# Patient Record
Sex: Male | Born: 1945 | Race: Black or African American | Hispanic: No | Marital: Married | Smoking: Former smoker
Health system: Southern US, Community
[De-identification: ages and names within clinical notes are randomized; demographics above are authoritative.]

## PROBLEM LIST (undated history)

## (undated) DIAGNOSIS — C61 Malignant neoplasm of prostate: Secondary | ICD-10-CM

## (undated) DIAGNOSIS — K2970 Gastritis, unspecified, without bleeding: Secondary | ICD-10-CM

## (undated) DIAGNOSIS — E785 Hyperlipidemia, unspecified: Secondary | ICD-10-CM

## (undated) DIAGNOSIS — E119 Type 2 diabetes mellitus without complications: Secondary | ICD-10-CM

## (undated) DIAGNOSIS — K7460 Unspecified cirrhosis of liver: Secondary | ICD-10-CM

## (undated) DIAGNOSIS — K219 Gastro-esophageal reflux disease without esophagitis: Secondary | ICD-10-CM

## (undated) DIAGNOSIS — C220 Liver cell carcinoma: Secondary | ICD-10-CM

## (undated) DIAGNOSIS — B1910 Unspecified viral hepatitis B without hepatic coma: Secondary | ICD-10-CM

## (undated) DIAGNOSIS — B1920 Unspecified viral hepatitis C without hepatic coma: Secondary | ICD-10-CM

## (undated) HISTORY — DX: Type 2 diabetes mellitus without complications: E11.9

## (undated) HISTORY — PX: PROSTATE BIOPSY: SHX241

## (undated) HISTORY — DX: Gastro-esophageal reflux disease without esophagitis: K21.9

## (undated) HISTORY — DX: Malignant neoplasm of prostate: C61

## (undated) HISTORY — PX: OTHER SURGICAL HISTORY: SHX169

---

## 2005-05-18 ENCOUNTER — Ambulatory Visit: Payer: Self-pay | Admitting: Gastroenterology

## 2007-10-30 ENCOUNTER — Ambulatory Visit: Payer: Self-pay | Admitting: Gastroenterology

## 2008-03-01 ENCOUNTER — Emergency Department: Payer: Self-pay | Admitting: Unknown Physician Specialty

## 2008-03-03 ENCOUNTER — Emergency Department: Payer: Self-pay | Admitting: Emergency Medicine

## 2008-03-05 ENCOUNTER — Emergency Department: Payer: Self-pay | Admitting: Emergency Medicine

## 2011-09-13 ENCOUNTER — Emergency Department: Payer: Self-pay | Admitting: Unknown Physician Specialty

## 2011-09-13 LAB — COMPREHENSIVE METABOLIC PANEL
Albumin: 4 g/dL (ref 3.4–5.0)
Anion Gap: 7 (ref 7–16)
BUN: 17 mg/dL (ref 7–18)
Bilirubin,Total: 0.5 mg/dL (ref 0.2–1.0)
Calcium, Total: 9.3 mg/dL (ref 8.5–10.1)
Chloride: 108 mmol/L — ABNORMAL HIGH (ref 98–107)
Co2: 24 mmol/L (ref 21–32)
EGFR (African American): >60
EGFR (Non-African Amer.): >60
Glucose: 93 mg/dL (ref 65–99)
SGOT(AST): 54 U/L — ABNORMAL HIGH (ref 15–37)
Total Protein: 8.3 g/dL — ABNORMAL HIGH (ref 6.4–8.2)

## 2011-09-13 LAB — CBC
HCT: 43.4 %
HGB: 13.7 g/dL
MCH: 27.1 pg
MCHC: 31.6 g/dL — ABNORMAL LOW
MCV: 86 fL
Platelet: 148 x10 3/mm 3 — ABNORMAL LOW
RBC: 5.06 x10 6/mm 3
RDW: 14.7 % — ABNORMAL HIGH
WBC: 9.1 x10 3/mm 3

## 2011-10-19 ENCOUNTER — Ambulatory Visit: Payer: Self-pay | Admitting: Unknown Physician Specialty

## 2011-11-05 ENCOUNTER — Ambulatory Visit: Payer: Self-pay | Admitting: Unknown Physician Specialty

## 2012-04-30 HISTORY — PX: COLONOSCOPY W/ BIOPSIES: SHX1374

## 2012-07-07 ENCOUNTER — Ambulatory Visit: Payer: BC Managed Care – PPO | Admitting: Internal Medicine

## 2012-07-11 ENCOUNTER — Ambulatory Visit: Payer: BC Managed Care – PPO | Admitting: Internal Medicine

## 2012-07-15 ENCOUNTER — Encounter: Payer: Self-pay | Admitting: Internal Medicine

## 2012-07-15 ENCOUNTER — Ambulatory Visit (INDEPENDENT_AMBULATORY_CARE_PROVIDER_SITE_OTHER): Payer: BC Managed Care – PPO | Admitting: Internal Medicine

## 2012-07-15 VITALS — BP 118/72 | HR 56 | Temp 97.8°F | Resp 10 | Ht 68.0 in | Wt 183.0 lb

## 2012-07-15 DIAGNOSIS — E119 Type 2 diabetes mellitus without complications: Secondary | ICD-10-CM

## 2012-07-15 MED ORDER — GLUCOSE BLOOD VI STRP: ORAL_STRIP | Status: DC

## 2012-07-15 MED ORDER — ONETOUCH ULTRASOFT LANCETS MISC: Status: DC

## 2012-07-15 NOTE — Progress Notes (Signed)
Subjective:     Patient ID: Rodney Moody, male   DOB: 03-12-1946, 67 y.o.   MRN: [id number]  HPI Rodney Moody is a 67 -year-old [place] American man, referred by his PCP, Dr. Wonda Cheng, due to a new diagnosis of diabetes. The patient was found to have a hemoglobin A1c of 7% a month ago. She was told that he might have diabetes, and was referred to endocrinology.   The patient has a family history of DM2 in his mother and brother. He is a very active life, walking every day at work ([employer]), and also when he is not working, he goes for walks with his wife, who was diagnosed with hypertension. Since his wife's diagnosis, they changed in way of eating, to include more vegetables, and baked rather than fried foods. He still had regular sodas and other sweets, but he had switched to drinking only water and cutting down on his sweets after he was told that he might have diabetes. Meals: - breakfast: skipping it or boiled eggs - lunch: breakfast, bag of chips, used to drink soft drinks - regular, not anymore, since his dx - dinner: baked chicken, green vegetables - snacks: 1 a day  Last eye exam was in January 2014. No diabetic retinopathy. He denies numbness and tingling in his feet. Per labs (06/19/2012) received from patient's PCP, the patient does not have kidney dysfunction, with the last BUN/creatinine being 18/0.99, for a GFR of 92. Other labs: lipid panel 245/89/101/126, LFTs at the upper limit of normal, TSH 0.776, vitamin D 29.5.  The patient has a past medical history of hepatitis C-followed by Dr. Marva Panda, vitamin D deficiency, history of prostate cancer, and GERD.  Past surgical history: Arthroscopic shoulder surgery (triceps repair left), prostatectomy  History   Social History  . Marital Status: Married    Spouse Name: N/A    Number of Children: 1   Occupational History  . Works at [employer], walking all day   Social History Main Topics  . Smoking status: Former Smoker    Quit date:  02/25/1998  . Smokeless tobacco: Never Used  . Alcohol Use: No  . Drug Use: No  . Sexually Active: Yes -- Male partner(s)   Social History Narrative   Married with 1 child   Current Outpatient Rx  Name  Route  Sig  Dispense  Refill  . glucose blood (ONE TOUCH ULTRA TEST) test strip      Use as instructed   100 each   12   . Lancets (ONETOUCH ULTRASOFT) lancets      Use as instructed   100 each   12   . Multiple Vitamin (MULTI-VITAMIN DAILY PO)   Oral   Take 2 tablets by mouth daily.         . pantoprazole (PROTONIX) 40 MG tablet   Oral   Take 40 mg by mouth daily.           No allergies.  Review of Systems - all negative Constitutional: no weight gain/loss, no fatigue, no subjective hyperthermia/hypothermia Eyes: no blurry vision, no xerophthalmia ENT: no sore throat, no nodules palpated in throat, no dysphagia/odynophagia, no hoarseness Cardiovascular: no CP/SOB/palpitations/leg swelling Respiratory: no cough/SOB Gastrointestinal: no N/V/D/C Musculoskeletal: no muscle/joint aches Skin: no rashes Neurological: no tremors/numbness/tingling/dizziness Psychiatric: no depression/anxiety  Objective:   Physical Exam BP 118/72  Pulse 56  Temp(Src) 97.8 F (36.6 C) (Oral)  Resp 10  Ht 5\' 8"  (1.727 m)  Wt 183 lb (83.008 kg)  BMI 27.83 kg/m2  SpO2 98% Wt Readings from Last 3 Encounters:  07/15/12 183 lb (83.008 kg)  Constitutional: slightly overweight, in NAD Eyes: PERRLA, EOMI, no exophthalmos ENT: moist mucous membranes, no thyromegaly, no cervical lymphadenopathy Cardiovascular: RRR, No MRG Respiratory: CTA B Gastrointestinal: abdomen soft, NT, ND, BS+ Musculoskeletal: no deformities, strength intact in all 4 Skin: moist, warm, no rashes Neurological: no tremor with outstretched hands, DTR normal in all 4  Assessment:     1. DM2, new dx - non-insulin-dependent, uncomplicated - HbA1C 7%, repeat 6.8%    Plan:     1. We had a long discussion  about what his diagnosis of diabetes entails, and also bothered by that we need to confirm it by repeating the hemoglobin A1c, which I ordered today. - Discussed about proper diet, and I strongly recommended that he doesn't skip his breakfast (which he started to do in an effort to lose some weight). I actually advised him to have the breakfast is the largest meal of the day, followed by lunch, and then a smaller meal for dinner. I advised him to use sweet potatoes or yams rather than white potatoes, brown rather than white rice, whole-grain rather than white bread, whole-wheat rather than white pasta, fruits rather than concentrated sweets, and as many vegetables as he can. I strongly advised him to stay off sodas, and drink water. I advised him to continue to exercise, however he does that by the nature of his work. - I explained the significance of the hemoglobin A1c test for diagnosis and for diabetes followup, and at the target hemoglobin A1c for patients with diabetes is lower than 7%  - we also discussed about possible consequences and complications of diabetes, including heart disease, stroke, kidney disease, peripheral neuropathy, eye disease, etc. - I explained interval for followup, usually every 3 months, and the type of tests that we do to check diabetes control - I recommended him to have an eye exam every year - I will recheck a hemoglobin A1c and microalbumin/creatinine ratio today - I advised him to join my chart, to where I will release the results - I gave him a sugar log, and I advised him how to fill it, in the next 3 months history check every 1 or 2 days, in a.m., fasting. He should bring the log to next appointment - I gave him a One Touch ultra meter, and I sent a prescription for strips and lancets to his pharmacy - We demonstrated how he should check his sugars - given foot care handout and explained the principles - given instructions for hypoglycemia management "15-15 rule" -  given a brochure about eating out, and healthy eating and diabetes - I referred him to diabetes education and nutrition for further education and advice - RTC in 3 months with his log  Office Visit on 07/15/2012  Component Date Value Range Status  . Hemoglobin A1C 07/15/2012 6.8* <5.7 % Final   Comment:  According to the ADA Clinical Practice Recommendations for 2011, when                          HbA1c is used as a screening test:                                                       >=6.5%   Diagnostic of Diabetes Mellitus                                     (if abnormal result is confirmed)                                                     5.7-6.4%   Increased risk of developing Diabetes Mellitus                                                     References:Diagnosis and Classification of Diabetes Mellitus,Diabetes                          Care,2011,34(Suppl 1):S62-S69 and Standards of Medical Care in                                  Diabetes - 2011,Diabetes Care,2011,34 (Suppl 1):S11-S61.                             . Mean Plasma Glucose 07/15/2012 148* <117 mg/dL Final  . Microalb, Ur 40/98/1191 0.50  0.00 - 1.89 mg/dL Final  . Creatinine, Urine 07/15/2012 153.8   Final  . Microalb Creat Ratio 07/15/2012 3.3  0.0 - 30.0 mg/g Final  Confirmed diabetes dx. Plan as above. Will release labs to MyChart.

## 2012-07-15 NOTE — Patient Instructions (Addendum)
Please join MyChart. I referred you to the Diabetes educators - they will call you with the appointment date and time. Please check your sugars once a day or once every 2 days. Please return in 3 months with your sugar log.

## 2012-07-16 ENCOUNTER — Encounter: Payer: Self-pay | Admitting: Internal Medicine

## 2012-07-16 LAB — MICROALBUMIN / CREATININE URINE RATIO: Microalb Creat Ratio: 3.3 mg/g (ref 0.0–30.0)

## 2012-07-22 ENCOUNTER — Encounter: Payer: BC Managed Care – PPO | Attending: Internal Medicine | Admitting: *Deleted

## 2012-07-22 VITALS — Ht 68.0 in | Wt 184.5 lb

## 2012-07-22 DIAGNOSIS — Z713 Dietary counseling and surveillance: Secondary | ICD-10-CM | POA: Insufficient documentation

## 2012-07-22 DIAGNOSIS — E119 Type 2 diabetes mellitus without complications: Secondary | ICD-10-CM

## 2012-07-27 ENCOUNTER — Encounter: Payer: Self-pay | Admitting: *Deleted

## 2012-07-27 NOTE — Progress Notes (Signed)
Patient was seen on 07/22/2012 for the first of a series of three diabetes self-management courses at the [facility]. Patient's most recent A1c was 6.8 % on 07/15/2012 The following learning objectives were met by the patient during this course:   Defines the role of glucose and insulin  Identifies type of diabetes and pathophysiology  Defines the diagnostic criteria for diabetes and prediabetes  States the risk factors for Type 2 Diabetes  States the symptoms of Type 2 Diabetes  Defines Type 2 Diabetes treatment goals  Defines Type 2 Diabetes treatment options  States the rationale for glucose monitoring  Identifies A1C, glucose targets, and testing times  Identifies proper sharps disposal  Defines the purpose of a diabetes food plan  Identifies carbohydrate food groups  Defines effects of carbohydrate foods on glucose levels  Identifies carbohydrate choices/grams/food labels  States benefits of physical activity and effect on glucose  Review of suggested activity guidelines  Handouts given during class include:  Type 2 Diabetes: Basics Book  My Food Plan Book  Food and Activity Log   Follow-Up Plan: Core Class 2

## 2012-08-12 ENCOUNTER — Encounter: Payer: BC Managed Care – PPO | Attending: Internal Medicine | Admitting: *Deleted

## 2012-08-12 DIAGNOSIS — E119 Type 2 diabetes mellitus without complications: Secondary | ICD-10-CM | POA: Insufficient documentation

## 2012-08-12 DIAGNOSIS — Z713 Dietary counseling and surveillance: Secondary | ICD-10-CM | POA: Insufficient documentation

## 2012-08-13 NOTE — Progress Notes (Signed)
  Patient was seen on 08/12/12 for the second of a series of three diabetes self-management courses at the [facility]. The following learning objectives were met by the patient during this course:   Explain basic nutrition maintenance and quality assurance  Describe causes, symptoms and treatment of hypoglycemia and hyperglycemia  Explain how to manage diabetes during illness  Describe the importance of good nutrition for health and healthy eating strategies  List strategies to follow meal plan when dining out  Describe the effects of alcohol on glucose and how to use it safely  Describe problem solving skills for day-to-day glucose challenges  Describe strategies to use when treatment plan needs to change  Identify important factors involved in successful weight loss  Describe ways to remain physically active  Describe the impact of regular activity on insulin resistance    Handouts given in class:  Refrigerator magnet for Sick Day Guidelines  [facility] Oral medication/insulin handout  Follow-Up Plan: Patient will attend the final class of the ADA Diabetes Self-Care Education.    

## 2012-08-26 ENCOUNTER — Encounter: Payer: BC Managed Care – PPO | Admitting: Dietician

## 2012-08-26 DIAGNOSIS — E119 Type 2 diabetes mellitus without complications: Secondary | ICD-10-CM

## 2012-08-27 NOTE — Progress Notes (Signed)
  Patient was seen on 08/26/2012 for the third of a series of three diabetes self-management courses at the [facility]. The following learning objectives were met by the patient during this course:  Ht: 68 in  WT: 184.5 lbs  BMI: 28.1 kg/m2  A1C: 6.8 (07/15/2012)    Describe how diabetes changes over time   Identify diabetes complications and ways to prevent them   Describe strategies that can promote heart health including lowering blood pressure and cholesterol   Describe strategies to lower dietary fat and sodium in the diet   Identify physical activities that benefit cardiovascular health   Evaluate success in meeting personal goal   Describe the belief that they can live successfully with diabetes day to day   Establish 2-3 goals that they will plan to diligently work on until they return for the free 28-month follow-up visit  The following handouts were given in class:  3 Month Follow Up Visit handout  Goal setting handout  Class evaluation form  Your patient has established the following 3 month goals for diabetes self-care:  Be active 100 minutes or more 3 times per week  To help manage my stress, I will do helping people at least 3 times a week by caring them places and doing chores for them   Follow-Up Plan: Patient will attend a 3 month follow-up visit for diabetes self-management education.

## 2012-10-24 ENCOUNTER — Encounter: Payer: Self-pay | Admitting: *Deleted

## 2012-10-24 ENCOUNTER — Encounter: Payer: BC Managed Care – PPO | Attending: Internal Medicine | Admitting: *Deleted

## 2012-10-24 DIAGNOSIS — Z713 Dietary counseling and surveillance: Secondary | ICD-10-CM | POA: Insufficient documentation

## 2012-10-24 DIAGNOSIS — E119 Type 2 diabetes mellitus without complications: Secondary | ICD-10-CM | POA: Insufficient documentation

## 2012-10-24 NOTE — Progress Notes (Signed)
  Patient was seen on 10/24/2012 for their 3 month follow-up as a part of the diabetes self-management courses at the [facility]. The following learning objectives were met by your patient during this visit:  At his last visit: Ht: 68 in  WT: 184.5 lbs  BMI: 28.1 kg/m2  A1C: 6.8 (07/15/2012)  Current weight on 10/24/2012 is 176.5 lb for weight loss of 8 pounds! Has not had another A1c since he attended class in March.  Your patient has established the following 3 month goals for diabetes self-care:  Be active 100 minutes or more 3 times per week yes, he is walking and climbing at work and he walks in addition to work too.  To help manage my stress, I will do helping people at least 3 times a week by caring them places and doing chores for them YES  Patient self reports the following:  Diabetes control has improved since diabetes self-management training: Definetly Number of days blood glucose is >200: none Last MD appointment for diabetes: March, before class Changes in treatment plan: he is walking more and he is eating breakfast now Confidence with ability to manage diabetes: Definetly Areas for improvement with diabetes self-care: no, doing very well Willingness to participate in diabetes support group: not at this time  Please see Diabetes Flow sheet for findings related to patient's self-care.  Follow-Up Plan: Patient is eligible for a "free" 30 minute diabetes self-care appointment in the next year. Patient to call and schedule as needed.

## 2012-11-28 ENCOUNTER — Ambulatory Visit: Payer: Self-pay | Admitting: Gastroenterology

## 2012-11-28 LAB — CBC WITH DIFFERENTIAL/PLATELET
Basophil #: 0 10*3/uL (ref 0.0–0.1)
Eosinophil #: 0.1 10*3/uL (ref 0.0–0.7)
HCT: 42.1 % (ref 40.0–52.0)
HGB: 14.4 g/dL (ref 13.0–18.0)
Lymphocyte #: 1.2 10*3/uL (ref 1.0–3.6)
Lymphocyte %: 26.1 %
MCH: 27.6 pg (ref 26.0–34.0)
MCHC: 34.2 g/dL (ref 32.0–36.0)
Neutrophil %: 62.3 %
Platelet: 159 10*3/uL (ref 150–440)
RBC: 5.21 10*6/uL (ref 4.40–5.90)
RDW: 14.7 % — ABNORMAL HIGH (ref 11.5–14.5)
WBC: 4.6 10*3/uL (ref 3.8–10.6)

## 2012-11-28 LAB — PROTIME-INR: INR: 1

## 2014-03-14 ENCOUNTER — Emergency Department: Payer: Self-pay | Admitting: Emergency Medicine

## 2014-03-15 LAB — CBC WITH DIFFERENTIAL/PLATELET
BASOS ABS: 0 10*3/uL (ref 0.0–0.1)
BASOS PCT: 0.3 %
EOS ABS: 0.1 10*3/uL (ref 0.0–0.7)
EOS PCT: 1.6 %
HCT: 40 % (ref 40.0–52.0)
HGB: 13 g/dL (ref 13.0–18.0)
LYMPHS ABS: 1.3 10*3/uL (ref 1.0–3.6)
LYMPHS PCT: 17.2 %
MCH: 27.2 pg (ref 26.0–34.0)
MCHC: 32.6 g/dL (ref 32.0–36.0)
MCV: 84 fL (ref 80–100)
MONOS PCT: 11.2 %
Monocyte #: 0.8 x10 3/mm (ref 0.2–1.0)
Neutrophil #: 5.1 10*3/uL (ref 1.4–6.5)
Neutrophil %: 69.7 %
Platelet: 154 10*3/uL (ref 150–440)
RBC: 4.79 10*6/uL (ref 4.40–5.90)
RDW: 15 % — ABNORMAL HIGH (ref 11.5–14.5)
WBC: 7.3 10*3/uL (ref 3.8–10.6)

## 2014-03-15 LAB — BASIC METABOLIC PANEL
Anion Gap: 4 — ABNORMAL LOW (ref 7–16)
BUN: 19 mg/dL — AB (ref 7–18)
CHLORIDE: 105 mmol/L (ref 98–107)
CO2: 30 mmol/L (ref 21–32)
Calcium, Total: 9.1 mg/dL (ref 8.5–10.1)
Creatinine: 1 mg/dL (ref 0.60–1.30)
EGFR (Non-African Amer.): >60
GLUCOSE: 124 mg/dL — AB (ref 65–99)
OSMOLALITY: 281 (ref 275–301)
Potassium: 4.7 mmol/L (ref 3.5–5.1)
SODIUM: 139 mmol/L (ref 136–145)
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
## 2014-11-08 ENCOUNTER — Telehealth: Payer: Self-pay | Admitting: Family Medicine

## 2014-11-08 NOTE — Telephone Encounter (Signed)
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

## 2014-11-10 MED ORDER — PANTOPRAZOLE SODIUM 40 MG PO TBEC: 40.0000 mg | DELAYED_RELEASE_TABLET | Freq: Every day | ORAL | Status: DC

## 2014-11-10 NOTE — Telephone Encounter (Signed)
Refilled 30 day suppy of Protonic sent to [facility] [address]. Patient has appointment on 11/19/2014

## 2014-11-19 ENCOUNTER — Ambulatory Visit (INDEPENDENT_AMBULATORY_CARE_PROVIDER_SITE_OTHER): Payer: BLUE CROSS/BLUE SHIELD | Admitting: Family Medicine

## 2014-11-19 ENCOUNTER — Encounter: Payer: Self-pay | Admitting: Family Medicine

## 2014-11-19 VITALS — BP 128/71 | HR 61 | Temp 97.6°F | Resp 17 | Ht 69.0 in | Wt 183.3 lb

## 2014-11-19 DIAGNOSIS — E1169 Type 2 diabetes mellitus with other specified complication: Secondary | ICD-10-CM | POA: Insufficient documentation

## 2014-11-19 DIAGNOSIS — K219 Gastro-esophageal reflux disease without esophagitis: Secondary | ICD-10-CM | POA: Insufficient documentation

## 2014-11-19 DIAGNOSIS — E119 Type 2 diabetes mellitus without complications: Secondary | ICD-10-CM | POA: Diagnosis not present

## 2014-11-19 DIAGNOSIS — E785 Hyperlipidemia, unspecified: Secondary | ICD-10-CM | POA: Insufficient documentation

## 2014-11-19 DIAGNOSIS — C61 Malignant neoplasm of prostate: Secondary | ICD-10-CM | POA: Insufficient documentation

## 2014-11-19 MED ORDER — PANTOPRAZOLE SODIUM 40 MG PO TBEC: 40.0000 mg | DELAYED_RELEASE_TABLET | Freq: Every day | ORAL | Status: DC

## 2014-11-19 NOTE — Progress Notes (Signed)
Name: Rodney Moody   MRN: [id number]    DOB: 1945/07/10   Date:11/19/2014       Progress Note  Subjective  Chief Complaint  Chief Complaint  Patient presents with  . Establish Care    NP (Dr. Liana Gerold)  . Diabetes    Diabetes He presents for his follow-up diabetic visit. He has type 2 diabetes mellitus. His disease course has been stable. There are no hypoglycemic associated symptoms. Pertinent negatives for diabetes include no chest pain, no fatigue, no polydipsia and no polyuria. Symptoms are stable. Risk factors for coronary artery disease include dyslipidemia, male sex and diabetes mellitus. Current diabetic treatment includes diet. His weight is stable. He is following a diabetic and generally healthy diet. He has not had a previous visit with a dietitian. He rarely participates in exercise. His breakfast blood glucose is taken between 7-8 am. His breakfast blood glucose range is generally 110-130 mg/dl. An ACE inhibitor/angiotensin II receptor blocker is not being taken.  Hyperlipidemia This is a chronic problem. The problem is uncontrolled. Recent lipid tests were reviewed and are high. Exacerbating diseases include diabetes. He has no history of hypothyroidism or liver disease. Pertinent negatives include no chest pain, leg pain, myalgias or shortness of breath. He is currently on no antihyperlipidemic treatment. Risk factors for coronary artery disease include diabetes mellitus, dyslipidemia, family history and male sex.  Heartburn He complains of belching and heartburn. He reports no abdominal pain, no chest pain, no choking, no coughing, no nausea or no sore throat. This is a chronic problem. The problem has been unchanged. The symptoms are aggravated by certain foods (orange juice, ). Pertinent negatives include no fatigue or melena. He has tried a PPI for the symptoms. The treatment provided significant relief. Past procedures do not include an EGD.      Past Medical History   Diagnosis Date  . GERD (gastroesophageal reflux disease)   . Diabetes mellitus without complication     borderline Diabetes, but no medication  . Prostate cancer     Past Surgical History  Procedure Laterality Date  . Left shoulder    . Prostate biopsy      Family History  Problem Relation Age of Onset  . Diabetes Mother   . Heart disease Brother   . Hypertension Brother     History   Social History  . Marital Status: Married    Spouse Name: N/A  . Number of Children: N/A  . Years of Education: N/A   Occupational History  . Not on file.   Social History Main Topics  . Smoking status: Former Smoker    Quit date: 02/25/1998  . Smokeless tobacco: Never Used  . Alcohol Use: No  . Drug Use: No  . Sexual Activity:    Partners: Female   Other Topics Concern  . Not on file   Social History Narrative   Married with 1 child           Current outpatient prescriptions:  .  Calcium Citrate-Vitamin D (CALCIUM CITRATE + D PO), Take 2,000 Units by mouth 2 (two) times daily., Disp: , Rfl:  .  glucose blood (ONE TOUCH ULTRA TEST) test strip, Use as instructed, Disp: 100 each, Rfl: 12 .  Lancets (ONETOUCH ULTRASOFT) lancets, Use as instructed, Disp: 100 each, Rfl: 12 .  Multiple Vitamin (MULTI-VITAMIN DAILY PO), Take 2 tablets by mouth daily., Disp: , Rfl:  .  pantoprazole (PROTONIX) 40 MG tablet, Take 1 tablet (40 mg  total) by mouth daily., Disp: 30 tablet, Rfl: 0  No Known Allergies   Review of Systems  Constitutional: Negative for fatigue.  HENT: Negative for sore throat.   Respiratory: Negative for cough, choking and shortness of breath.   Cardiovascular: Negative for chest pain.  Gastrointestinal: Positive for heartburn. Negative for nausea, abdominal pain and melena.  Musculoskeletal: Negative for myalgias.  Endo/Heme/Allergies: Negative for polydipsia.      Objective  Filed Vitals:   11/19/14 0959  BP: 128/71  Pulse: 61  Temp: 97.6 F (36.4 C)   TempSrc: Oral  Resp: 17  Height: 5\' 9"  (1.753 m)  Weight: 183 lb 4.8 oz (83.144 kg)  SpO2: 98%    Physical Exam  Constitutional: He is oriented to person, place, and time and well-developed, well-nourished, and in no distress.  HENT:  Head: Normocephalic.  Cardiovascular: Normal rate and regular rhythm.   Pulmonary/Chest: Effort normal and breath sounds normal.  Musculoskeletal: He exhibits no edema.  Neurological: He is alert and oriented to person, place, and time.  Skin: Skin is warm and dry.  Nursing note and vitals reviewed.   Assessment & Plan  1. Gastroesophageal reflux disease, esophagitis presence not specified Symptoms responsive to chronic PPI therapy. - pantoprazole (PROTONIX) 40 MG tablet; Take 1 tablet (40 mg total) by mouth daily.  Dispense: 90 tablet; Refill: 1  2. Diabetes mellitus without complication Patient is on no pharmacotherapy for diabetes mellitus. Recheck A1c and follow-up. - HgB A1c - Urine Microalbumin w/creat. ratio  3. Hyperlipidemia  - Lipid panel - Comprehensive Metabolic Panel (CMET)    Rodney Moody [facility] 11/19/2014 10:50 AM

## 2014-11-27 LAB — COMPREHENSIVE METABOLIC PANEL
ALBUMIN: 4.5 g/dL (ref 3.6–4.8)
ALK PHOS: 50 IU/L (ref 39–117)
ALT: 48 IU/L — ABNORMAL HIGH (ref 0–44)
AST: 49 IU/L — ABNORMAL HIGH (ref 0–40)
Albumin/Globulin Ratio: 1.5 (ref 1.1–2.5)
BILIRUBIN TOTAL: 0.6 mg/dL (ref 0.0–1.2)
BUN/Creatinine Ratio: 22 (ref 10–22)
BUN: 20 mg/dL (ref 8–27)
CHLORIDE: 99 mmol/L (ref 97–108)
CO2: 26 mmol/L (ref 18–29)
CREATININE: 0.91 mg/dL (ref 0.76–1.27)
Calcium: 9.5 mg/dL (ref 8.6–10.2)
GFR calc non Af Amer: 87 mL/min/{1.73_m2} (ref >59)
GFR, EST AFRICAN AMERICAN: 100 mL/min/{1.73_m2} (ref >59)
GLUCOSE: 87 mg/dL (ref 65–99)
Globulin, Total: 3 g/dL (ref 1.5–4.5)
Potassium: 4.5 mmol/L (ref 3.5–5.2)
Sodium: 141 mmol/L (ref 134–144)
TOTAL PROTEIN: 7.5 g/dL (ref 6.0–8.5)

## 2014-11-27 LAB — HEMOGLOBIN A1C
ESTIMATED AVERAGE GLUCOSE: 143 mg/dL
Hgb A1c MFr Bld: 6.6 % — ABNORMAL HIGH (ref 4.8–5.6)

## 2014-11-27 LAB — MICROALBUMIN / CREATININE URINE RATIO
Creatinine, Urine: 127.1 mg/dL
MICROALB/CREAT RATIO: 3.3 mg/g creat (ref 0.0–30.0)
MICROALBUM., U, RANDOM: 4.2 ug/mL

## 2014-11-27 LAB — LIPID PANEL
CHOL/HDL RATIO: 2.1 ratio (ref 0.0–5.0)
Cholesterol, Total: 212 mg/dL — ABNORMAL HIGH (ref 100–199)
HDL: 102 mg/dL (ref >39)
LDL Calculated: 99 mg/dL (ref 0–99)
Triglycerides: 57 mg/dL (ref 0–149)
VLDL Cholesterol Cal: 11 mg/dL (ref 5–40)

## 2014-11-30 ENCOUNTER — Telehealth: Payer: Self-pay

## 2014-11-30 NOTE — Telephone Encounter (Signed)
Left pt vm of labs

## 2015-02-25 ENCOUNTER — Ambulatory Visit (INDEPENDENT_AMBULATORY_CARE_PROVIDER_SITE_OTHER): Payer: BLUE CROSS/BLUE SHIELD | Admitting: Family Medicine

## 2015-02-25 ENCOUNTER — Encounter: Payer: Self-pay | Admitting: Family Medicine

## 2015-02-25 VITALS — BP 136/72 | HR 65 | Temp 98.2°F | Resp 18 | Ht 69.0 in | Wt 187.0 lb

## 2015-02-25 DIAGNOSIS — E119 Type 2 diabetes mellitus without complications: Secondary | ICD-10-CM | POA: Diagnosis not present

## 2015-02-25 DIAGNOSIS — R748 Abnormal levels of other serum enzymes: Secondary | ICD-10-CM

## 2015-02-25 DIAGNOSIS — E785 Hyperlipidemia, unspecified: Secondary | ICD-10-CM | POA: Diagnosis not present

## 2015-02-25 LAB — GLUCOSE, POCT (MANUAL RESULT ENTRY): POC GLUCOSE: 97 mg/dL (ref 70–99)

## 2015-02-25 NOTE — Progress Notes (Signed)
Name: Rodney Moody   MRN: [id number]    DOB: 05-09-1945   Date:02/25/2015       Progress Note  Subjective  Chief Complaint  Chief Complaint  Patient presents with  . Diabetes    pt here for 3 month follow up and lab work  . Hyperlipidemia    Diabetes He presents for his follow-up diabetic visit. He has type 2 diabetes mellitus. His disease course has been stable. There are no hypoglycemic associated symptoms. There are no diabetic associated symptoms. Pertinent negatives for diabetes include no chest pain. Current diabetic treatment includes diet. His weight is stable. An ACE inhibitor/angiotensin II receptor blocker is not being taken.  Hyperlipidemia This is a chronic problem. The problem is controlled. Pertinent negatives include no chest pain or shortness of breath. Current antihyperlipidemic treatment includes statins and diet change.    Past Medical History  Diagnosis Date  . GERD (gastroesophageal reflux disease)   . Diabetes mellitus without complication ([place])     borderline Diabetes, but no medication  . Prostate cancer [facility])     Past Surgical History  Procedure Laterality Date  . Left shoulder    . Prostate biopsy      Family History  Problem Relation Age of Onset  . Diabetes Mother   . Heart disease Brother   . Hypertension Brother     Social History   Social History  . Marital Status: Married    Spouse Name: N/A  . Number of Children: N/A  . Years of Education: N/A   Occupational History  . Not on file.   Social History Main Topics  . Smoking status: Former Smoker    Quit date: 02/25/1998  . Smokeless tobacco: Never Used  . Alcohol Use: No  . Drug Use: No  . Sexual Activity:    Partners: Female   Other Topics Concern  . Not on file   Social History Narrative   Married with 1 child          Current outpatient prescriptions:  .  Calcium Citrate-Vitamin D (CALCIUM CITRATE + D PO), Take 2,000 Units by mouth 2 (two) times daily., Disp: ,  Rfl:  .  glucose blood (ONE TOUCH ULTRA TEST) test strip, Use as instructed, Disp: 100 each, Rfl: 12 .  Lancets (ONETOUCH ULTRASOFT) lancets, Use as instructed, Disp: 100 each, Rfl: 12 .  Multiple Vitamin (MULTI-VITAMIN DAILY PO), Take 2 tablets by mouth daily., Disp: , Rfl:  .  pantoprazole (PROTONIX) 40 MG tablet, Take 1 tablet (40 mg total) by mouth daily., Disp: 90 tablet, Rfl: 1  No Known Allergies  Review of Systems  Constitutional: Negative for fever and chills.  Respiratory: Negative for shortness of breath.   Cardiovascular: Negative for chest pain.  Gastrointestinal: Negative for abdominal pain.    Objective  Filed Vitals:   02/25/15 1120  BP: 136/72  Pulse: 65  Temp: 98.2 F (36.8 C)  Resp: 18  Height: 5\' 9"  (1.753 m)  Weight: 187 lb (84.823 kg)  SpO2: 95%    Physical Exam  Constitutional: He is oriented to person, place, and time and well-developed, well-nourished, and in no distress.  HENT:  Head: Normocephalic and atraumatic.  Cardiovascular: Normal rate, regular rhythm and normal heart sounds.   Pulmonary/Chest: Effort normal. He has wheezes. He has no rales.  Abdominal: Soft. Bowel sounds are normal. There is no tenderness.  Neurological: He is alert and oriented to person, place, and time.  Nursing note and vitals  reviewed.    Assessment & Plan  1. Diabetes mellitus without complication ([place]) Diet-controlled diabetes mellitus. We will obtain an A1c today and follow-up. - POCT HgB A1C - POCT Glucose (CBG)  2. Hyperlipidemia Recheck FLP and consider statin therapy for primary prevention. - Lipid Profile - Comprehensive Metabolic Panel (CMET)  3. Elevated liver enzymes  - Comprehensive Metabolic Panel (CMET)   Ayvin Lipinski [facility] 02/25/2015 11:59 AM

## 2015-03-05 LAB — LIPID PANEL
Chol/HDL Ratio: 2.3 ratio units (ref 0.0–5.0)
Cholesterol, Total: 206 mg/dL — ABNORMAL HIGH (ref 100–199)
HDL: 90 mg/dL (ref >39)
LDL Calculated: 104 mg/dL — ABNORMAL HIGH (ref 0–99)
Triglycerides: 62 mg/dL (ref 0–149)
VLDL Cholesterol Cal: 12 mg/dL (ref 5–40)

## 2015-03-05 LAB — COMPREHENSIVE METABOLIC PANEL
ALT: 43 IU/L (ref 0–44)
AST: 45 IU/L — ABNORMAL HIGH (ref 0–40)
Albumin/Globulin Ratio: 1.3 (ref 1.1–2.5)
Albumin: 4.3 g/dL (ref 3.6–4.8)
Alkaline Phosphatase: 54 IU/L (ref 39–117)
BILIRUBIN TOTAL: 0.5 mg/dL (ref 0.0–1.2)
BUN / CREAT RATIO: 14 (ref 10–22)
BUN: 14 mg/dL (ref 8–27)
CHLORIDE: 100 mmol/L (ref 97–106)
CO2: 26 mmol/L (ref 18–29)
CREATININE: 0.99 mg/dL (ref 0.76–1.27)
Calcium: 9.3 mg/dL (ref 8.6–10.2)
GFR calc Af Amer: 90 mL/min/{1.73_m2} (ref >59)
GFR calc non Af Amer: 78 mL/min/{1.73_m2} (ref >59)
Globulin, Total: 3.3 g/dL (ref 1.5–4.5)
Glucose: 90 mg/dL (ref 65–99)
Potassium: 4.5 mmol/L (ref 3.5–5.2)
Sodium: 141 mmol/L (ref 136–144)
TOTAL PROTEIN: 7.6 g/dL (ref 6.0–8.5)

## 2015-03-11 ENCOUNTER — Telehealth: Payer: Self-pay | Admitting: Family Medicine

## 2015-03-11 NOTE — Telephone Encounter (Signed)
Patient called back for his lab results.  I relayed the result note from Dr. Manuella Ghazi.  Patient stated that his next appointment is on 05/26/14 and wanted to know if he could just want until then to start the statin therapy or does he need to come sooner.

## 2015-03-15 ENCOUNTER — Other Ambulatory Visit: Payer: Self-pay | Admitting: Family Medicine

## 2015-03-15 NOTE — Telephone Encounter (Signed)
Left voicemail asking patient to call clinic and schedule a follow up appointment with the next 2 weeks

## 2015-03-18 ENCOUNTER — Ambulatory Visit (INDEPENDENT_AMBULATORY_CARE_PROVIDER_SITE_OTHER): Payer: BLUE CROSS/BLUE SHIELD | Admitting: Family Medicine

## 2015-03-18 ENCOUNTER — Encounter: Payer: Self-pay | Admitting: Family Medicine

## 2015-03-18 VITALS — BP 134/75 | HR 67 | Temp 98.5°F | Resp 16 | Ht 69.0 in | Wt 183.0 lb

## 2015-03-18 DIAGNOSIS — E785 Hyperlipidemia, unspecified: Secondary | ICD-10-CM | POA: Diagnosis not present

## 2015-03-18 DIAGNOSIS — E119 Type 2 diabetes mellitus without complications: Secondary | ICD-10-CM | POA: Diagnosis not present

## 2015-03-18 DIAGNOSIS — R748 Abnormal levels of other serum enzymes: Secondary | ICD-10-CM | POA: Diagnosis not present

## 2015-03-18 LAB — POCT GLYCOSYLATED HEMOGLOBIN (HGB A1C): HEMOGLOBIN A1C: 6.4

## 2015-03-18 LAB — GLUCOSE, POCT (MANUAL RESULT ENTRY): POC Glucose: 94 mg/dl (ref 70–99)

## 2015-03-18 NOTE — Progress Notes (Signed)
Name: Rodney Moody   MRN: [id number]    DOB: [birth year]   Date:03/18/2015       Progress Note  Subjective  Chief Complaint  Chief Complaint  Patient presents with  . Advice Only    Discuss blood work    Diabetes He presents for his follow-up diabetic visit. He has type 2 diabetes mellitus. His disease course has been stable. Pertinent negatives for diabetes include no chest pain, no fatigue, no foot paresthesias, no polydipsia, no polyphagia and no polyuria. Current diabetic treatment includes diet. His weight is stable. He monitors blood glucose at home 1-2 x per week.  Hyperlipidemia This is a chronic problem. The problem is controlled. Recent lipid tests were reviewed and are high. Pertinent negatives include no chest pain. He is currently on no antihyperlipidemic treatment.    Past Medical History  Diagnosis Date  . GERD (gastroesophageal reflux disease)   . Diabetes mellitus without complication ([place])     borderline Diabetes, but no medication  . Prostate cancer [facility])     Past Surgical History  Procedure Laterality Date  . Left shoulder    . Prostate biopsy      Family History  Problem Relation Age of Onset  . Diabetes Mother   . Heart disease Brother   . Hypertension Brother     Social History   Social History  . Marital Status: Married    Spouse Name: N/A  . Number of Children: N/A  . Years of Education: N/A   Occupational History  . Not on file.   Social History Main Topics  . Smoking status: Former Smoker    Quit date: 02/25/1998  . Smokeless tobacco: Never Used  . Alcohol Use: No  . Drug Use: No  . Sexual Activity:    Partners: Female   Other Topics Concern  . Not on file   Social History Narrative   Married with 1 child           Current outpatient prescriptions:  .  Calcium Citrate-Vitamin D (CALCIUM CITRATE + D PO), Take 2,000 Units by mouth 2 (two) times daily., Disp: , Rfl:  .  glucose blood (ONE TOUCH ULTRA TEST) test strip, Use  as instructed, Disp: 100 each, Rfl: 12 .  Lancets (ONETOUCH ULTRASOFT) lancets, Use as instructed, Disp: 100 each, Rfl: 12 .  Multiple Vitamin (MULTI-VITAMIN DAILY PO), Take 2 tablets by mouth daily., Disp: , Rfl:  .  pantoprazole (PROTONIX) 40 MG tablet, TAKE 1 TABLET BY MOUTH EVERY DAY, Disp: 90 tablet, Rfl: 1  No Known Allergies   Review of Systems  Constitutional: Negative for fever, chills and fatigue.  Cardiovascular: Negative for chest pain.  Gastrointestinal: Negative for heartburn, nausea, vomiting, abdominal pain, diarrhea, constipation and blood in stool.  Endo/Heme/Allergies: Negative for polydipsia and polyphagia.      Objective  Filed Vitals:   03/18/15 1204  BP: 134/75  Pulse: 67  Temp: 98.5 F (36.9 C)  TempSrc: Oral  Resp: 16  Height: 5\' 9"  (1.753 m)  Weight: 183 lb (83.008 kg)  SpO2: 99%    Physical Exam  Constitutional: He is oriented to person, place, and time and well-developed, well-nourished, and in no distress.  HENT:  Head: Normocephalic and atraumatic.  Cardiovascular: Normal rate and regular rhythm.   No murmur heard. Pulmonary/Chest: Effort normal and breath sounds normal. He has no wheezes.  Abdominal: Soft. Bowel sounds are normal. There is no tenderness.  Neurological: He is alert and oriented to  person, place, and time.  Skin: Skin is warm and dry.  Nursing note and vitals reviewed.   Assessment & Plan  1. Hyperlipidemia With diabetes, we will consider starting on statin therapy.  2. Controlled type 2 diabetes mellitus without complication, without long-term current use of insulin (HCC) Stable and well controlled with A1c of 6.4%, which is improved from 6.6%. - POCT HgB A1C - POCT Glucose (CBG)  3. Elevated liver enzymes  - Comprehensive Metabolic Panel (CMET)   Leilyn Frayre [facility] 03/18/2015 12:39 PM

## 2015-03-19 LAB — COMPREHENSIVE METABOLIC PANEL
ALBUMIN: 4.6 g/dL (ref 3.6–4.8)
ALK PHOS: 55 IU/L (ref 39–117)
ALT: 45 IU/L — ABNORMAL HIGH (ref 0–44)
AST: 54 IU/L — ABNORMAL HIGH (ref 0–40)
Albumin/Globulin Ratio: 1.4 (ref 1.1–2.5)
BUN / CREAT RATIO: 22 (ref 10–22)
BUN: 19 mg/dL (ref 8–27)
Bilirubin Total: 0.5 mg/dL (ref 0.0–1.2)
CO2: 26 mmol/L (ref 18–29)
CREATININE: 0.86 mg/dL (ref 0.76–1.27)
Calcium: 9.8 mg/dL (ref 8.6–10.2)
Chloride: 96 mmol/L — ABNORMAL LOW (ref 97–106)
GFR calc non Af Amer: 89 mL/min/{1.73_m2} (ref >59)
GFR, EST AFRICAN AMERICAN: 103 mL/min/{1.73_m2} (ref >59)
GLOBULIN, TOTAL: 3.2 g/dL (ref 1.5–4.5)
Glucose: 85 mg/dL (ref 65–99)
Potassium: 4.4 mmol/L (ref 3.5–5.2)
Sodium: 138 mmol/L (ref 136–144)
Total Protein: 7.8 g/dL (ref 6.0–8.5)

## 2015-05-27 ENCOUNTER — Encounter: Payer: Self-pay | Admitting: Family Medicine

## 2015-05-27 ENCOUNTER — Ambulatory Visit (INDEPENDENT_AMBULATORY_CARE_PROVIDER_SITE_OTHER): Payer: BLUE CROSS/BLUE SHIELD | Admitting: Family Medicine

## 2015-05-27 VITALS — BP 128/77 | HR 65 | Temp 98.4°F | Resp 18 | Ht 69.0 in | Wt 184.9 lb

## 2015-05-27 DIAGNOSIS — E785 Hyperlipidemia, unspecified: Secondary | ICD-10-CM | POA: Diagnosis not present

## 2015-05-27 DIAGNOSIS — R748 Abnormal levels of other serum enzymes: Secondary | ICD-10-CM

## 2015-05-27 DIAGNOSIS — E119 Type 2 diabetes mellitus without complications: Secondary | ICD-10-CM | POA: Diagnosis not present

## 2015-05-27 NOTE — Progress Notes (Signed)
Name: Rodney Moody   MRN: [id number]    DOB: [birth year]   Date:05/27/2015       Progress Note  Subjective  Chief Complaint  Chief Complaint  Patient presents with  . Follow-up    3 mo  . Diabetes  . Hyperlipidemia  . Gastroesophageal Reflux    Diabetes He presents for his follow-up diabetic visit. He has type 2 diabetes mellitus. His disease course has been stable. Pertinent negatives for diabetes include no chest pain, no foot paresthesias, no polydipsia and no polyuria. Current diabetic treatment includes diet. Frequency home blood tests: Does not check his BG.  Hyperlipidemia This is a chronic problem. The problem is uncontrolled. Recent lipid tests were reviewed and are high. Pertinent negatives include no chest pain or shortness of breath. He is currently on no antihyperlipidemic treatment (Has elevated liver enzymes.).    Past Medical History  Diagnosis Date  . GERD (gastroesophageal reflux disease)   . Diabetes mellitus without complication ([facility])     borderline Diabetes, but no medication  . Prostate cancer [facility])     Past Surgical History  Procedure Laterality Date  . Left shoulder    . Prostate biopsy      Family History  Problem Relation Age of Onset  . Diabetes Mother   . Heart disease Brother   . Hypertension Brother     Social History   Social History  . Marital Status: Married    Spouse Name: N/A  . Number of Children: N/A  . Years of Education: N/A   Occupational History  . Not on file.   Social History Main Topics  . Smoking status: Former Smoker    Quit date: 02/25/1998  . Smokeless tobacco: Never Used  . Alcohol Use: No  . Drug Use: No  . Sexual Activity:    Partners: Female   Other Topics Concern  . Not on file   Social History Narrative   Married with 1 child          Current outpatient prescriptions:  .  Calcium Citrate-Vitamin D (CALCIUM CITRATE + D PO), Take 2,000 Units by mouth 2 (two) times daily., Disp: , Rfl:  .   glucose blood (ONE TOUCH ULTRA TEST) test strip, Use as instructed, Disp: 100 each, Rfl: 12 .  Lancets (ONETOUCH ULTRASOFT) lancets, Use as instructed, Disp: 100 each, Rfl: 12 .  Multiple Vitamin (MULTI-VITAMIN DAILY PO), Take 2 tablets by mouth daily., Disp: , Rfl:  .  pantoprazole (PROTONIX) 40 MG tablet, TAKE 1 TABLET BY MOUTH EVERY DAY, Disp: 90 tablet, Rfl: 1  No Known Allergies   Review of Systems  Constitutional: Negative for fever, chills and malaise/fatigue.  Respiratory: Negative for shortness of breath.   Cardiovascular: Negative for chest pain and palpitations.  Gastrointestinal: Negative for abdominal pain.  Endo/Heme/Allergies: Negative for polydipsia.    Objective  Filed Vitals:   05/27/15 1133  BP: 128/77  Pulse: 65  Temp: 98.4 F (36.9 C)  TempSrc: Oral  Resp: 18  Height: 5\' 9"  (1.753 m)  Weight: 184 lb 14.4 oz (83.87 kg)  SpO2: 98%    Physical Exam  Constitutional: He is oriented to person, place, and time and well-developed, well-nourished, and in no distress.  HENT:  Head: Normocephalic and atraumatic.  Cardiovascular: Normal rate and regular rhythm.   Pulmonary/Chest: Effort normal and breath sounds normal.  Abdominal: Soft. Bowel sounds are normal.  Neurological: He is alert and oriented to person, place, and time.  Nursing  note and vitals reviewed.   Assessment & Plan  1. Elevated liver enzymes Obtain standard initial workup for transaminitis. - Comprehensive Metabolic Panel (CMET) - US Abdomen Limited RUQ; Future - Gamma GT - Hepatitis, Acute  2. Hyperlipidemia Statin therapy indicated but on hold secondary to elevated liver enzymes.  3. Controlled type 2 diabetes mellitus without complication, without long-term current use of insulin (HCC) Diet controlled. Recheck A1c in 4-6 weeks.   Karess Harner [facility] 05/27/2015 11:45 AM

## 2015-05-28 LAB — COMPREHENSIVE METABOLIC PANEL
A/G RATIO: 1.4 (ref 1.1–2.5)
ALBUMIN: 4.6 g/dL (ref 3.6–4.8)
ALT: 39 IU/L (ref 0–44)
AST: 41 IU/L — ABNORMAL HIGH (ref 0–40)
Alkaline Phosphatase: 61 IU/L (ref 39–117)
BILIRUBIN TOTAL: 0.4 mg/dL (ref 0.0–1.2)
BUN / CREAT RATIO: 19 (ref 10–22)
BUN: 18 mg/dL (ref 8–27)
CHLORIDE: 97 mmol/L (ref 96–106)
CO2: 25 mmol/L (ref 18–29)
Calcium: 9.8 mg/dL (ref 8.6–10.2)
Creatinine, Ser: 0.97 mg/dL (ref 0.76–1.27)
GFR calc non Af Amer: 80 mL/min/{1.73_m2} (ref >59)
GFR, EST AFRICAN AMERICAN: 92 mL/min/{1.73_m2} (ref >59)
Globulin, Total: 3.2 g/dL (ref 1.5–4.5)
Glucose: 95 mg/dL (ref 65–99)
POTASSIUM: 4.5 mmol/L (ref 3.5–5.2)
Sodium: 139 mmol/L (ref 134–144)
Total Protein: 7.8 g/dL (ref 6.0–8.5)

## 2015-05-28 LAB — HEPATITIS PANEL, ACUTE
HEP A IGM: NEGATIVE
Hep B C IgM: NEGATIVE
Hep C Virus Ab: >11 s/co ratio — ABNORMAL HIGH (ref 0.0–0.9)
Hepatitis B Surface Ag: NEGATIVE

## 2015-05-28 LAB — GAMMA GT: GGT: 70 IU/L — AB (ref 0–65)

## 2015-06-10 ENCOUNTER — Ambulatory Visit
Admission: RE | Admit: 2015-06-10 | Discharge: 2015-06-10 | Disposition: A | Discharge status: Home / Self Care | Payer: BLUE CROSS/BLUE SHIELD | Source: Ambulatory Visit | Attending: Family Medicine | Admitting: Family Medicine

## 2015-06-10 DIAGNOSIS — R160 Hepatomegaly, not elsewhere classified: Secondary | ICD-10-CM | POA: Insufficient documentation

## 2015-06-10 DIAGNOSIS — R748 Abnormal levels of other serum enzymes: Secondary | ICD-10-CM

## 2015-07-01 ENCOUNTER — Encounter: Payer: Self-pay | Admitting: Family Medicine

## 2015-07-01 ENCOUNTER — Ambulatory Visit (INDEPENDENT_AMBULATORY_CARE_PROVIDER_SITE_OTHER): Payer: BLUE CROSS/BLUE SHIELD | Admitting: Family Medicine

## 2015-07-01 VITALS — BP 130/80 | HR 62 | Temp 97.8°F | Resp 18 | Ht 69.0 in | Wt 182.3 lb

## 2015-07-01 DIAGNOSIS — R768 Other specified abnormal immunological findings in serum: Secondary | ICD-10-CM

## 2015-07-01 DIAGNOSIS — R894 Abnormal immunological findings in specimens from other organs, systems and tissues: Secondary | ICD-10-CM | POA: Diagnosis not present

## 2015-07-01 DIAGNOSIS — R160 Hepatomegaly, not elsewhere classified: Secondary | ICD-10-CM | POA: Insufficient documentation

## 2015-07-01 NOTE — Progress Notes (Signed)
Name: Rodney Moody   MRN: [id number]    DOB: [birth year]   Date:07/01/2015       Progress Note  Subjective  Chief Complaint  Chief Complaint  Patient presents with  . Hyperlipidemia  . Abdominal Pain    follow up Ultrasound from February    HPI  Follow up from RUQ U/S Pt. Returns to discuss the RUQ Ultrasound findings. It was obtained on June 10, 2015. Showed a 5.2 x 4.4 x 3.8 cm mass in left hepatic lobe. Pt. deniess any abdominal pain, nausea, vomiting, fatigue, changes in urine or stool.  Past Medical History  Diagnosis Date  . GERD (gastroesophageal reflux disease)   . Diabetes mellitus without complication ([place])     borderline Diabetes, but no medication  . Prostate cancer [facility])     Past Surgical History  Procedure Laterality Date  . Left shoulder    . Prostate biopsy      Family History  Problem Relation Age of Onset  . Diabetes Mother   . Heart disease Brother   . Hypertension Brother     Social History   Social History  . Marital Status: Married    Spouse Name: N/A  . Number of Children: N/A  . Years of Education: N/A   Occupational History  . Not on file.   Social History Main Topics  . Smoking status: Former Smoker    Quit date: 02/25/1998  . Smokeless tobacco: Never Used  . Alcohol Use: No  . Drug Use: No  . Sexual Activity:    Partners: Female   Other Topics Concern  . Not on file   Social History Narrative   Married with 1 child           Current outpatient prescriptions:  .  Calcium Citrate-Vitamin D (CALCIUM CITRATE + D PO), Take 2,000 Units by mouth 2 (two) times daily., Disp: , Rfl:  .  glucose blood (ONE TOUCH ULTRA TEST) test strip, Use as instructed, Disp: 100 each, Rfl: 12 .  Lancets (ONETOUCH ULTRASOFT) lancets, Use as instructed, Disp: 100 each, Rfl: 12 .  Multiple Vitamin (MULTI-VITAMIN DAILY PO), Take 2 tablets by mouth daily., Disp: , Rfl:  .  pantoprazole (PROTONIX) 40 MG tablet, TAKE 1 TABLET BY MOUTH EVERY DAY,  Disp: 90 tablet, Rfl: 1  No Known Allergies   ROS    Objective  Filed Vitals:   07/01/15 1101  BP: 130/80  Pulse: 62  Temp: 97.8 F (36.6 C)  TempSrc: Oral  Resp: 18  Height: 5\' 9"  (1.753 m)  Weight: 182 lb 4.8 oz (82.691 kg)  SpO2: 97%    Physical Exam  Constitutional: He is oriented to person, place, and time and well-developed, well-nourished, and in no distress.  HENT:  Head: Normocephalic and atraumatic.  Cardiovascular: Normal rate and regular rhythm.   Pulmonary/Chest: Effort normal and breath sounds normal.  Abdominal: Soft. Bowel sounds are normal.  Neurological: He is alert and oriented to person, place, and time.  Nursing note and vitals reviewed.      Recent Results (from the past 2160 hour(s))  Comprehensive Metabolic Panel (CMET)     Status: Abnormal   Collection Time: 05/27/15 12:03 PM  Result Value Ref Range   Glucose 95 65 - 99 mg/dL   BUN 18 8 - 27 mg/dL   Creatinine, Ser 0.97 0.76 - 1.27 mg/dL   GFR calc non Af Amer 80 >59 mL/min/1.73   GFR calc Af Amer 92 >59 mL/min/1.73  BUN/Creatinine Ratio 19 10 - 22   Sodium 139 134 - 144 mmol/L   Potassium 4.5 3.5 - 5.2 mmol/L   Chloride 97 96 - 106 mmol/L   CO2 25 18 - 29 mmol/L   Calcium 9.8 8.6 - 10.2 mg/dL   Total Protein 7.8 6.0 - 8.5 g/dL   Albumin 4.6 3.6 - 4.8 g/dL   Globulin, Total 3.2 1.5 - 4.5 g/dL   Albumin/Globulin Ratio 1.4 1.1 - 2.5   Bilirubin Total 0.4 0.0 - 1.2 mg/dL   Alkaline Phosphatase 61 39 - 117 IU/L   AST 41 (H) 0 - 40 IU/L   ALT 39 0 - 44 IU/L  Gamma GT     Status: Abnormal   Collection Time: 05/27/15 12:03 PM  Result Value Ref Range   GGT 70 (H) 0 - 65 IU/L  Hepatitis, Acute     Status: Abnormal   Collection Time: 05/27/15 12:03 PM  Result Value Ref Range   Hep A IgM Negative Negative   Hepatitis B Surface Ag Negative Negative   Hep B C IgM Negative Negative   Hep C Virus Ab >11.0 (H) 0.0 - 0.9 s/co ratio    Comment:                                   Negative:      < 0.8                              Indeterminate: 0.8 - 0.9                                   Positive:     > 0.9  The CDC recommends that a positive HCV antibody result  be followed up with a HCV Nucleic Acid Amplification  test [id number]).      Assessment & Plan  1. Mass of left lobe of liver Reviewed ultrasound findings, we will order MRI with contrast for further evaluation of the left hepatic lobe mass. - MR Abdomen W Contrast; Future  2. Hepatitis C antibody test positive Patient has reportedly been treated for hepatitis C in the past. We will refer to gastroenterology in light of the positive antibody test and that hepatic lobe mass for further evaluation - Ambulatory referral to Gastroenterology   Dossie Der [facility] 07/01/2015 11:40 AM

## 2015-07-27 ENCOUNTER — Other Ambulatory Visit: Payer: Self-pay | Admitting: Family Medicine

## 2015-07-27 ENCOUNTER — Ambulatory Visit
Admission: RE | Admit: 2015-07-27 | Discharge: 2015-07-27 | Disposition: A | Discharge status: Home / Self Care | Payer: BLUE CROSS/BLUE SHIELD | Source: Ambulatory Visit | Attending: Family Medicine | Admitting: Family Medicine

## 2015-07-27 DIAGNOSIS — R160 Hepatomegaly, not elsewhere classified: Secondary | ICD-10-CM

## 2015-07-27 DIAGNOSIS — R599 Enlarged lymph nodes, unspecified: Secondary | ICD-10-CM | POA: Insufficient documentation

## 2015-07-27 DIAGNOSIS — I700 Atherosclerosis of aorta: Secondary | ICD-10-CM | POA: Insufficient documentation

## 2015-07-27 MED ORDER — GADOBENATE DIMEGLUMINE 529 MG/ML IV SOLN
20.0000 mL | Freq: Once | INTRAVENOUS | Status: AC | PRN
  Administered 2015-07-27: 17 mL via INTRAVENOUS

## 2015-07-29 ENCOUNTER — Telehealth: Payer: Self-pay

## 2015-07-29 NOTE — Telephone Encounter (Signed)
error 

## 2015-07-29 NOTE — Telephone Encounter (Signed)
  Oncology Nurse Navigator Documentation  Navigator Location: [facility] (07/29/15 1000) Navigator Encounter Type: Telephone;Introductory phone call (07/29/15 1000) Telephone: Lahoma Crocker Call;Appt Confirmation/Clarification (07/29/15 1000) Abnormal Finding Date: 06/10/15 (07/29/15 1000)         Treatment Phase: Abnormal Scans (07/29/15 1000) Barriers/Navigation Needs: Coordination of Care (07/29/15 1000)   Interventions: Coordination of Care (07/29/15 1000)   Coordination of Care: Appts (07/29/15 1000)        Acuity: Level 2 (07/29/15 1000)   Acuity Level 2: Initial guidance, education and coordination as needed;Educational needs;Assistance expediting appointments;Ongoing guidance and education throughout treatment as needed (07/29/15 1000)     Time Spent with Patient: 15 (07/29/15 1000)   New referral for liver mass. Pt left voicemail to arrange consult with medical oncology. Appt scheduled for 4/4 1500 in [place] with Dr Rogue Bussing. Dr Trena Platt office to also be notified of date/time of consult

## 2015-07-29 NOTE — Telephone Encounter (Signed)
  Oncology Nurse Navigator Documentation  Navigator Location: [facility] (07/29/15 1600) Navigator Encounter Type: Telephone (07/29/15 1600)                   Interventions: Coordination of Care (07/29/15 1600)   Coordination of Care: Appts (07/29/15 1600)                  Time Spent with Patient: 15 (07/29/15 1600)   Rodney Moody returned call. Appt confirmed for new consult with Dr Rogue Bussing in [facility] 08-02-15 at 1500. Directions given. Read back performed.

## 2015-08-02 ENCOUNTER — Inpatient Hospital Stay: Payer: BLUE CROSS/BLUE SHIELD | Attending: Internal Medicine | Admitting: Internal Medicine

## 2015-08-02 ENCOUNTER — Inpatient Hospital Stay: Payer: BLUE CROSS/BLUE SHIELD

## 2015-08-02 VITALS — BP 155/85 | HR 64 | Temp 98.1°F | Resp 18 | Wt 182.1 lb

## 2015-08-02 DIAGNOSIS — K219 Gastro-esophageal reflux disease without esophagitis: Secondary | ICD-10-CM | POA: Diagnosis not present

## 2015-08-02 DIAGNOSIS — Z923 Personal history of irradiation: Secondary | ICD-10-CM | POA: Insufficient documentation

## 2015-08-02 DIAGNOSIS — I700 Atherosclerosis of aorta: Secondary | ICD-10-CM | POA: Insufficient documentation

## 2015-08-02 DIAGNOSIS — Z87891 Personal history of nicotine dependence: Secondary | ICD-10-CM | POA: Diagnosis not present

## 2015-08-02 DIAGNOSIS — Z79899 Other long term (current) drug therapy: Secondary | ICD-10-CM | POA: Diagnosis not present

## 2015-08-02 DIAGNOSIS — R160 Hepatomegaly, not elsewhere classified: Secondary | ICD-10-CM | POA: Diagnosis not present

## 2015-08-02 DIAGNOSIS — R7989 Other specified abnormal findings of blood chemistry: Secondary | ICD-10-CM | POA: Insufficient documentation

## 2015-08-02 DIAGNOSIS — B1920 Unspecified viral hepatitis C without hepatic coma: Secondary | ICD-10-CM | POA: Insufficient documentation

## 2015-08-02 DIAGNOSIS — Z8546 Personal history of malignant neoplasm of prostate: Secondary | ICD-10-CM | POA: Diagnosis not present

## 2015-08-02 DIAGNOSIS — Z7982 Long term (current) use of aspirin: Secondary | ICD-10-CM | POA: Diagnosis not present

## 2015-08-02 DIAGNOSIS — R894 Abnormal immunological findings in specimens from other organs, systems and tissues: Secondary | ICD-10-CM | POA: Diagnosis not present

## 2015-08-02 DIAGNOSIS — R599 Enlarged lymph nodes, unspecified: Secondary | ICD-10-CM | POA: Diagnosis not present

## 2015-08-02 DIAGNOSIS — E119 Type 2 diabetes mellitus without complications: Secondary | ICD-10-CM | POA: Insufficient documentation

## 2015-08-02 DIAGNOSIS — R1013 Epigastric pain: Secondary | ICD-10-CM | POA: Diagnosis not present

## 2015-08-02 LAB — COMPREHENSIVE METABOLIC PANEL
ALBUMIN: 4 g/dL (ref 3.5–5.0)
ALT: 56 U/L (ref 17–63)
AST: 58 U/L — AB (ref 15–41)
Alkaline Phosphatase: 64 U/L (ref 38–126)
Anion gap: 6 (ref 5–15)
BUN: 24 mg/dL — AB (ref 6–20)
CHLORIDE: 105 mmol/L (ref 101–111)
CO2: 27 mmol/L (ref 22–32)
CREATININE: 1.12 mg/dL (ref 0.61–1.24)
Calcium: 9.3 mg/dL (ref 8.9–10.3)
GFR calc Af Amer: >60 mL/min (ref >60)
GLUCOSE: 117 mg/dL — AB (ref 65–99)
POTASSIUM: 3.9 mmol/L (ref 3.5–5.1)
Sodium: 138 mmol/L (ref 135–145)
Total Bilirubin: 0.3 mg/dL (ref 0.3–1.2)
Total Protein: 8.2 g/dL — ABNORMAL HIGH (ref 6.5–8.1)

## 2015-08-02 LAB — CBC WITH DIFFERENTIAL/PLATELET
BASOS ABS: 0 10*3/uL (ref 0–0.1)
BASOS PCT: 0 %
Eosinophils Absolute: 0.1 10*3/uL (ref 0–0.7)
Eosinophils Relative: 3 %
HEMATOCRIT: 41 % (ref 40.0–52.0)
HEMOGLOBIN: 13.5 g/dL (ref 13.0–18.0)
LYMPHS PCT: 30 %
Lymphs Abs: 1.4 10*3/uL (ref 1.0–3.6)
MCH: 27.2 pg (ref 26.0–34.0)
MCHC: 32.8 g/dL (ref 32.0–36.0)
MCV: 82.9 fL (ref 80.0–100.0)
MONO ABS: 0.4 10*3/uL (ref 0.2–1.0)
MONOS PCT: 9 %
NEUTROS ABS: 2.9 10*3/uL (ref 1.4–6.5)
NEUTROS PCT: 58 %
Platelets: 164 10*3/uL (ref 150–440)
RBC: 4.95 MIL/uL (ref 4.40–5.90)
RDW: 14.4 % (ref 11.5–14.5)
WBC: 4.9 10*3/uL (ref 3.8–10.6)

## 2015-08-02 LAB — PROTIME-INR
INR: 0.98
PROTHROMBIN TIME: 13.2 s (ref 11.4–15.0)

## 2015-08-02 LAB — APTT: aPTT: 34 seconds (ref 24–36)

## 2015-08-02 NOTE — Progress Notes (Signed)
[place] NOTE  Patient Care Team: Roselee Nova, MD as PCP - General (Family Medicine)  CHIEF COMPLAINTS/PURPOSE OF CONSULTATION:   # March 2017- LEFT LOBE LIVER MASS- 7.2x5.1x4.8cm ? Extension in distal left portal Vein;Porta hepatis adenopathy- 2 .1 x 1.9 cm- Recm Bx  # 2002- Hepatitis C s/p ? Interferon injections  # 2013-  PROSTATE CANCER [Radiation seeds[facility]; Urology]  HISTORY OF PRESENTING ILLNESS:  Rodney Moody 70 y.o.  male with a history of hepatitis C and history of borderline diabetes/not on medications- noted to have slightly elevated/abnormal liver function which was initially attributed to his statins. Patient has a follow-up had ultrasound that was abnormal- 4 left lobe liver mass/which is subsequently followed up by an MRI of the liver with above findings. He has been deferred was for further recommendations and evaluations.  Patient's appetite is good. Denies any weight loss. Denies any leg swelling. No nausea no vomiting. No chest pain. No cough. Denies any abdominal distention. He intermittently has epigastric discomfort which he attributes to history of reflux disease.   ROS: A complete 10 point review of system is done which is negative except mentioned above in history of present illness  MEDICAL HISTORY:  Past Medical History  Diagnosis Date  . GERD (gastroesophageal reflux disease)   . Diabetes mellitus without complication ([place])     borderline Diabetes, but no medication  . Prostate cancer [facility])     SURGICAL HISTORY: Past Surgical History  Procedure Laterality Date  . Left shoulder    . Prostate biopsy      SOCIAL HISTORY: Works in [employer]. Lives in [place] and son. No alcohol. No smoking. Social History   Social History  . Marital Status: Married    Spouse Name: N/A  . Number of Children: N/A  . Years of Education: N/A   Occupational History  . Not on file.   Social History Main Topics  . Smoking status:  Former Smoker    Quit date: 02/25/1998  . Smokeless tobacco: Never Used  . Alcohol Use: No  . Drug Use: No  . Sexual Activity:    Partners: Female   Other Topics Concern  . Not on file   Social History Narrative   Married with 1 child          FAMILY HISTORY: Family History  Problem Relation Age of Onset  . Diabetes Mother   . Heart disease Brother   . Hypertension Brother     ALLERGIES:  has No Known Allergies.  MEDICATIONS:  Current Outpatient Prescriptions  Medication Sig Dispense Refill  . aspirin 81 MG tablet Take 81 mg by mouth daily.    . Calcium Citrate-Vitamin D (CALCIUM CITRATE + D PO) Take 2,000 Units by mouth 2 (two) times daily.    Marland Kitchen glucose blood (ONE TOUCH ULTRA TEST) test strip Use as instructed 100 each 12  . Lancets (ONETOUCH ULTRASOFT) lancets Use as instructed 100 each 12  . Multiple Vitamin (MULTI-VITAMIN DAILY PO) Take 2 tablets by mouth daily.    . pantoprazole (PROTONIX) 40 MG tablet TAKE 1 TABLET BY MOUTH EVERY DAY 90 tablet 1   No current facility-administered medications for this visit.      Marland Kitchen  PHYSICAL EXAMINATION: ECOG PERFORMANCE STATUS: 0 - Asymptomatic  Filed Vitals:   08/02/15 1509  BP: 155/85  Pulse: 64  Temp: 98.1 F (36.7 C)  Resp: 18   Filed Weights   08/02/15 1509  Weight: 182 lb  1.6 oz (82.6 kg)    GENERAL: Well-nourished well-developed; Alert, no distress and comfortable.   Alone.  EYES: no pallor or icterus OROPHARYNX: no thrush or ulceration; good dentition  NECK: supple, no masses felt LYMPH:  no palpable lymphadenopathy in the cervical, axillary or inguinal regions LUNGS: clear to auscultation and  No wheeze or crackles HEART/CVS: regular rate & rhythm and no murmurs; No lower extremity edema ABDOMEN: abdomen soft, non-tender and normal bowel sounds Musculoskeletal:no cyanosis of digits and no clubbing  PSYCH: alert & oriented x 3 with fluent speech NEURO: no focal motor/sensory deficits SKIN:  no rashes  or significant lesions  LABORATORY DATA:  I have reviewed the data as listed Lab Results  Component Value Date   WBC 7.3 03/15/2014   HGB 13.0 03/15/2014   HCT 40.0 03/15/2014   MCV 84 03/15/2014   PLT 154 03/15/2014    Recent Labs  03/04/15 1054 03/18/15 1255 05/27/15 1203  NA 141 138 139  K 4.5 4.4 4.5  CL 100 96* 97  CO2 26 26 25   GLUCOSE 90 85 95  BUN 14 19 18   CREATININE 0.99 0.86 0.97  CALCIUM 9.3 9.8 9.8  GFRNONAA 78 89 80  GFRAA 90 103 92  PROT 7.6 7.8 7.8  ALBUMIN 4.3 4.6 4.6  AST 45* 54* 41*  ALT 43 45* 39  ALKPHOS 54 55 61  BILITOT 0.5 0.5 0.4    RADIOGRAPHIC STUDIES: I have personally reviewed the radiological images as listed and agreed with the findings in the report. Mr Abdomen W Wo Contrast  07/27/2015  CLINICAL DATA:  Left hepatic lobe mass. Abnormal liver function tests. Diabetes. Prostate cancer. Gastroesophageal reflux disease. Hepatitis-C. EXAM: MRI ABDOMEN WITHOUT AND WITH CONTRAST TECHNIQUE: Multiplanar multisequence MR imaging of the abdomen was performed both before and after the administration of intravenous contrast. CONTRAST:  9mL MULTIHANCE GADOBENATE DIMEGLUMINE 529 MG/ML IV SOLN COMPARISON:  06/10/2015 abdominal ultrasound. FINDINGS: Lower chest: Normal heart size without pericardial or pleural effusion. Hepatobiliary: Scattered tiny T2 hyperintense liver lesions are likely cysts. No cirrhosis. Within the lateral segment left liver lobe (segments 2 and 3) is a 7.2 x 5.1 x 4.8 cm relatively ill-defined and infiltrative mass. minimally T2 hyperintense and heterogeneous. T1 hypo intense. No arterial phase post-contrast enhancement. Heterogeneously hypo enhancing on portal venous phase and delayed imaging. Example image 9/series 3, 10/100, and 17/12. Demonstrates extension into the distal left portal vein, including on image 17/ series 12 and image 14/ series 16. Hepatic veins patent. Normal gallbladder, without biliary ductal dilatation. Pancreas:  Normal, without mass or ductal dilatation. Spleen: Normal in size, without focal abnormality. Adrenals/Urinary Tract: Normal adrenal glands. Bilateral renal cysts. No suspicious renal lesion or evidence of hydronephrosis. Stomach/Bowel: Proximal gastric underdistention. Apparent wall thickening is likely secondary. Otherwise normal stomach and abdominal bowel loops. Vascular/Lymphatic: Advanced aortic atherosclerosis. Porta hepatis adenopathy, immediately caudal to the hepatic mass. Example 2.1 x 1.9 cm on image 14/series 3. Other: No ascites. Musculoskeletal: No acute osseous abnormality. IMPRESSION: 1. Left hepatic lobe mass with tumor extension into the peripheral most left portal vein. 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 Given lack of arterial phase enhancement and history of a previous primary malignancy, tissue sampling should be considered for confirmation. 2. Adjacent porta hepatis adenopathy is suspicious for localized metastasis. Alternatively, given history of hepatitis-C, this could be reactive. 3. Advanced atherosclerosis. Electronically Signed   By: Abigail Miyamoto M.D.   On: 07/27/2015  09:51    ASSESSMENT & PLAN:   #  Left hepatic lobe mass [7.2x5.1x4.8cm ] with tumor extension into the peripheral left portal vein/in the context of hepatitis C- highly concerning for hepatocellular cancer. However MRI not characteristic. Recommend tissue diagnosis. Check CBC CMP and AFP PT PTT.  # Hepatitis C- most recent antibody was positive. Awaiting a GI evaluation/ hepatitis titers.  # History of prostate cancer status post radiation seed implant. Clinically in remission.  # Patient follow-up with me in approximately 3-4 days after the ultrasound guided biopsy. I reviewed the images myself reviewed the images with the patient detail.   Thank you Dr. Manuella Ghazi for allowing me to participate in the care of your pleasant patient. Please do not  hesitate to contact me with questions or concerns in the interim.  # 45 minutes face-to-face with the patient discussing the above plan of care; more than 50% of time spent on prognosis/ natural history; counseling and coordination.    Cammie Sickle, MD 08/02/2015 3:33 PM

## 2015-08-02 NOTE — Progress Notes (Signed)
Patient here today as new evaluation regarding liver mass.  Referred by Dr. Manuella Ghazi.  Patient has history of prostate cancer.

## 2015-08-03 LAB — AFP TUMOR MARKER: AFP TUMOR MARKER: 1665 ng/mL — AB (ref 0.0–8.3)

## 2015-08-04 ENCOUNTER — Encounter: Payer: Self-pay | Admitting: *Deleted

## 2015-08-04 ENCOUNTER — Telehealth: Payer: Self-pay | Admitting: Internal Medicine

## 2015-08-04 NOTE — Telephone Encounter (Signed)
Patient's imaging reviewed in the [facility] tumor conference on April 6th- based on imaging-although not classic/history of hepatitis C/elevated AFP highly suggestive of hepatoma. Recommend holding off biopsy at this time.   Heather- cancel the ultrasound guided biopsy. I will see him back in [place] on the 11th- I discussed the next plan of care. Please inform pt that the biopsy will be cancelled.

## 2015-08-04 NOTE — Progress Notes (Signed)
RN received phone call from speciality scheduling. U/s guided liver bx is scheduled for next Thursday 08/11/15. Pt will need to arrive at 8am for a 9am procedure. Pt will be NPO after midnight. [facility] scheduling will contact pt with appt.

## 2015-08-04 NOTE — Telephone Encounter (Signed)
msg sent to cancer center scheduling to cnl u/s guided bx. Scheduling to contact RN in radiology to communicate that this procedure will be cnl as well.  I left a msg for pt to contact cancer center to discuss his medical care.

## 2015-08-05 NOTE — Telephone Encounter (Signed)
Pt returned phone call from RN. Explained to patient that his bx will be cnl. At this time, bx is not indicated per mds in radiology case conference. Most likely r/t clinical h/o hep C.  Pt states that he is unable to make the apt on Tuesday with Dr. Rogue Bussing. He would like to come on Friday to see Dr. Rogue Bussing at 230pm if possible. Msg sent to cancer center scheduling to arrange.

## 2015-08-07 ENCOUNTER — Encounter: Payer: Self-pay | Admitting: *Deleted

## 2015-08-07 ENCOUNTER — Emergency Department
Admission: EM | Admit: 2015-08-07 | Discharge: 2015-08-07 | Disposition: A | Discharge status: Home / Self Care | Payer: BLUE CROSS/BLUE SHIELD | Attending: Emergency Medicine | Admitting: Emergency Medicine

## 2015-08-07 ENCOUNTER — Emergency Department: Payer: BLUE CROSS/BLUE SHIELD

## 2015-08-07 DIAGNOSIS — Z87891 Personal history of nicotine dependence: Secondary | ICD-10-CM | POA: Diagnosis not present

## 2015-08-07 DIAGNOSIS — R1013 Epigastric pain: Secondary | ICD-10-CM | POA: Diagnosis present

## 2015-08-07 DIAGNOSIS — R110 Nausea: Secondary | ICD-10-CM | POA: Insufficient documentation

## 2015-08-07 DIAGNOSIS — Z8546 Personal history of malignant neoplasm of prostate: Secondary | ICD-10-CM | POA: Diagnosis not present

## 2015-08-07 DIAGNOSIS — E119 Type 2 diabetes mellitus without complications: Secondary | ICD-10-CM | POA: Insufficient documentation

## 2015-08-07 DIAGNOSIS — R61 Generalized hyperhidrosis: Secondary | ICD-10-CM | POA: Diagnosis not present

## 2015-08-07 DIAGNOSIS — R079 Chest pain, unspecified: Secondary | ICD-10-CM | POA: Insufficient documentation

## 2015-08-07 DIAGNOSIS — Z7982 Long term (current) use of aspirin: Secondary | ICD-10-CM | POA: Insufficient documentation

## 2015-08-07 HISTORY — DX: Unspecified viral hepatitis C without hepatic coma: B19.20

## 2015-08-07 LAB — COMPREHENSIVE METABOLIC PANEL
ALK PHOS: 55 U/L (ref 38–126)
ALT: 53 U/L (ref 17–63)
ANION GAP: 5 (ref 5–15)
AST: 58 U/L — ABNORMAL HIGH (ref 15–41)
Albumin: 3.9 g/dL (ref 3.5–5.0)
BUN: 20 mg/dL (ref 6–20)
CALCIUM: 9.1 mg/dL (ref 8.9–10.3)
CO2: 26 mmol/L (ref 22–32)
CREATININE: 1 mg/dL (ref 0.61–1.24)
Chloride: 105 mmol/L (ref 101–111)
Glucose, Bld: 97 mg/dL (ref 65–99)
Potassium: 3.8 mmol/L (ref 3.5–5.1)
SODIUM: 136 mmol/L (ref 135–145)
TOTAL PROTEIN: 7.7 g/dL (ref 6.5–8.1)
Total Bilirubin: 0.4 mg/dL (ref 0.3–1.2)

## 2015-08-07 LAB — URINALYSIS COMPLETE WITH MICROSCOPIC (ARMC ONLY)
BILIRUBIN URINE: NEGATIVE
Bacteria, UA: NONE SEEN
GLUCOSE, UA: NEGATIVE mg/dL
Hgb urine dipstick: NEGATIVE
KETONES UR: NEGATIVE mg/dL
Leukocytes, UA: NEGATIVE
NITRITE: NEGATIVE
PROTEIN: NEGATIVE mg/dL
SPECIFIC GRAVITY, URINE: 1.027 (ref 1.005–1.030)
Squamous Epithelial / LPF: NONE SEEN
pH: 5 (ref 5.0–8.0)

## 2015-08-07 LAB — CBC
HCT: 39.5 % — ABNORMAL LOW (ref 40.0–52.0)
HEMOGLOBIN: 13 g/dL (ref 13.0–18.0)
MCH: 27 pg (ref 26.0–34.0)
MCHC: 32.9 g/dL (ref 32.0–36.0)
MCV: 82.1 fL (ref 80.0–100.0)
PLATELETS: 147 10*3/uL — AB (ref 150–440)
RBC: 4.81 MIL/uL (ref 4.40–5.90)
RDW: 14.5 % (ref 11.5–14.5)
WBC: 4.4 10*3/uL (ref 3.8–10.6)

## 2015-08-07 LAB — TROPONIN I

## 2015-08-07 MED ORDER — SODIUM CHLORIDE 0.9 % IV BOLUS (SEPSIS)
1000.0000 mL | Freq: Once | INTRAVENOUS | Status: AC
  Administered 2015-08-07: 1000 mL via INTRAVENOUS

## 2015-08-07 MED ORDER — FENTANYL CITRATE (PF) 100 MCG/2ML IJ SOLN
50.0000 ug | Freq: Once | INTRAMUSCULAR | Status: AC
  Administered 2015-08-07: 50 ug via INTRAVENOUS
  Filled 2015-08-07: qty 2

## 2015-08-07 MED ORDER — ONDANSETRON HCL 4 MG/2ML IJ SOLN
4.0000 mg | Freq: Once | INTRAMUSCULAR | Status: AC
  Administered 2015-08-07: 4 mg via INTRAVENOUS
  Filled 2015-08-07: qty 2

## 2015-08-07 MED ORDER — GI COCKTAIL ~~LOC~~
30.0000 mL | Freq: Once | ORAL | Status: AC
  Administered 2015-08-07: 30 mL via ORAL
  Filled 2015-08-07: qty 30

## 2015-08-07 NOTE — ED Notes (Signed)
Patient states that he has been having abd pain for approx. 1 week. Patient was seen by his PCP and had lab and a MRI but has not followed up yet. Patient states that this morning while sitting at the table the pain became severe and he felt like he was going to pass out.

## 2015-08-07 NOTE — ED Provider Notes (Signed)
[facility] Emergency Department Provider Note  ____________________________________________  Time seen: Approximately 8:47 AM  I have reviewed the triage vital signs and the nursing notes.   HISTORY  Chief Complaint Abdominal Pain    HPI Rodney Moody is a 70 y.o. male with a history of hepatitis C treated with interferon 2005, DM, GERD, prostate cancer presenting with epigastric and chest pain. The patient has recently been under oncologic surveillance in the setting of abnormal LFTs that were initially attributed to his statin, but were followed up with an ultrasound which showed multiple left liver lobe lesions. His follow-up MRI did show a large left liver lobemass and he was referred to oncology. His imaging was reviewed in the [facility] clinic, and it was felt that his imaging was most consistent with hepatoma rather than a hepatocellular carcinoma. Initially in the workup, the patient did not have any pain or discomfort. However, since his MRI he has been having an epigastric pain that radiates upwards into the chest. This morning he was eating breakfast and developed severe pain associated with diaphoresis. His pain is similar to previous GERD but much worse in severity. He otherwise denies that his pain has been associated with food. He denies any shortness of breath, palpitations, lightheadedness or syncope. Pain does improve with burping. No lower extremity swelling   Past Medical History  Diagnosis Date  . GERD (gastroesophageal reflux disease)   . Diabetes mellitus without complication ([place])     borderline Diabetes, but no medication  . Prostate cancer ([place])   . Hepatitis C     Patient Active Problem List   Diagnosis Date Noted  . Mass of left lobe of liver 07/01/2015  . Hepatitis C antibody test positive 07/01/2015  . Elevated liver enzymes 03/18/2015  . GERD (gastroesophageal reflux disease) 11/19/2014  . Diabetes mellitus type 2,  controlled, without complications ([place]) [id number]  . Prostate cancer ([place]) 11/19/2014  . Hyperlipidemia 11/19/2014    Past Surgical History  Procedure Laterality Date  . Left shoulder    . Prostate biopsy      Current Outpatient Rx  Name  Route  Sig  Dispense  Refill  . aspirin 81 MG tablet   Oral   Take 81 mg by mouth daily.         . pantoprazole (PROTONIX) 40 MG tablet      TAKE 1 TABLET BY MOUTH EVERY DAY   90 tablet   1     Allergies Review of patient's allergies indicates no known allergies.  Family History  Problem Relation Age of Onset  . Diabetes Mother   . Heart disease Brother   . Hypertension Brother     Social History Social History  Substance Use Topics  . Smoking status: Former Smoker    Quit date: 02/25/1998  . Smokeless tobacco: Never Used  . Alcohol Use: No    Review of Systems Constitutional: No fever/chills. Eyes: No visual changes. ENT: No sore throat. No congestion or rhinorrhea. Cardiovascular: Positive chest pain. Denies palpitations. Respiratory: Denies shortness of breath.  No cough. Gastrointestinal: Positive epigastric abdominal pain.  Positive nausea, no vomiting.  No diarrhea.  No constipation. Genitourinary: Negative for dysuria. Musculoskeletal: Negative for back pain. Skin: Negative for rash. Neurological: Negative for headaches. No focal numbness, tingling or weakness.   10-point ROS otherwise negative.  ____________________________________________   PHYSICAL EXAM:  VITAL SIGNS: ED Triage Vitals  Enc Vitals Group     BP 08/07/15 0829 162/76  mmHg     Pulse Rate 08/07/15 0829 86     Resp 08/07/15 0829 18     Temp 08/07/15 0829 98.3 F (36.8 C)     Temp Source 08/07/15 0829 Oral     SpO2 08/07/15 0829 99 %     Weight 08/07/15 0829 180 lb (81.647 kg)     Height 08/07/15 0829 5\' 8"  (1.727 m)     Head Cir --      Peak Flow --      Pain Score 08/07/15 0829 7     Pain Loc --      Pain Edu? --      Excl. in  [place]? --     Constitutional: Alert and oriented. Well appearing and in no acute distress. Answers questions appropriately. Eyes: Conjunctivae are normal.  EOMI. No scleral icterus. Head: Atraumatic. Nose: No congestion/rhinnorhea. Mouth/Throat: Mucous membranes are moist.  Neck: No stridor.  Supple.   Cardiovascular: Normal rate, regular rhythm. No murmurs, rubs or gallops.  Respiratory: Normal respiratory effort.  No accessory muscle use or retractions. Lungs CTAB.  No wheezes, rales or ronchi. Gastrointestinal: Soft, nontender and nondistended.  No guarding or rebound.  No peritoneal signs. I am unable to reproduce the patient's pain with palpation. Musculoskeletal: No LE edema. No ttp in the calves or palpable cords.  Negative Homan's sign. Neurologic:  A&Ox3.  Speech is clear.  Face and smile are symmetric.  EOMI.  Moves all extremities well. Skin:  Skin is warm, dry and intact. No rash noted. No jaundice. Psychiatric: Mood and affect are normal. Speech and behavior are normal.  Normal judgement.  ____________________________________________   LABS (all labs ordered are listed, but only abnormal results are displayed)  Labs Reviewed  CBC - Abnormal; Notable for the following:    HCT 39.5 (*)    Platelets 147 (*)    All other components within normal limits  COMPREHENSIVE METABOLIC PANEL - Abnormal; Notable for the following:    AST 58 (*)    All other components within normal limits  TROPONIN I  URINALYSIS COMPLETEWITH MICROSCOPIC ([facility] ONLY)   ____________________________________________  EKG  ED ECG REPORT I, Eula Listen, the attending physician, personally viewed and interpreted this ECG.   Date: 08/07/2015  EKG Time: 833  Rate: 73  Rhythm: normal sinus rhythm, right bundle branch block  Axis: nl  Intervals: Normal  ST&T Change: No ST elevation.  ____________________________________________  RADIOLOGY  Dg Chest 2 View  08/07/2015  CLINICAL DATA:   Pain EXAM: CHEST  2 VIEW COMPARISON:  Sep 13, 2011 FINDINGS: The heart size and mediastinal contours are within normal limits. Both lungs are clear. The visualized skeletal structures are unremarkable. IMPRESSION: No active cardiopulmonary disease. Electronically Signed   By: Dorise Bullion III M.D   On: 08/07/2015 09:18    ____________________________________________   PROCEDURES  Procedure(s) performed: None  Critical Care performed: No ____________________________________________   INITIAL IMPRESSION / ASSESSMENT AND PLAN / ED COURSE  Pertinent labs & imaging results that were available during my care of the patient were reviewed by me and considered in my medical decision making (see chart for details).  70 y.o. male with a history of hepatitis C currently undergoing evaluation for a liver mass of unknown etiology presenting with epigastric pain radiating into the chest. I will evaluate the patient for GERD versus ACS or MI. Liver pathology or gallbladder pathology as a cause for the pain is less likely given the location, although it  is possible. The patient has not had any biopsy or intervention so unlikely to be a bleed. After the patient's laboratory studies returned, we'll reevaluate the patient to make a decision about whether to proceed with any additional imaging.  ----------------------------------------- 11:02 AM on 08/07/2015 -----------------------------------------  The patient's pain is completely resolved at this time. His EKG shows a right bundle-branch block, but no ischemic changes. Unfortunately, do not have a previous EKG for comparison. It is possible that this morning's symptoms were due to reflux, but I will ask the patient to follow up with his primary care physician for further cardiac evaluation. The patient's laboratory studies are reassuring with a negative troponin, and normal electrolytes and his hepatic function panel is baseline for him. His chest x-ray  does not show any acute cardiopulmonary process. At this time, I'll plan to discharge the patient home with close PMD follow-up. He has oncology follow-up on Friday. Return precautions as well as follow-up instructions were discussed.  ____________________________________________  FINAL CLINICAL IMPRESSION(S) / ED DIAGNOSES  Final diagnoses:  Chest pain, unspecified chest pain type  Epigastric pain  Diaphoresis  Nausea without vomiting      NEW MEDICATIONS STARTED DURING THIS VISIT:  New Prescriptions   No medications on file     Eula Listen, MD 08/07/15 1107

## 2015-08-07 NOTE — ED Notes (Signed)
Pt states abd pain that radiates up to his epigastric region and states he feels like he needs to burp, pt states he recently had an MRI and has an appt to speak with his PCP about the results, states he was told he has some mass in his abdoemen, pt awake and alert

## 2015-08-07 NOTE — Discharge Instructions (Signed)
Please make an appointment with your primary care doctor to discuss your abdominal and chest pain. Your primary care physician may want to send you for an outpatient stress test evaluation.  Please keep your appointment with the oncologist on Friday.  Please return to the emergency department if you develop severe pain, shortness of breath, lightheadedness or fainting, fever, or any other symptoms concerning to you.

## 2015-08-09 ENCOUNTER — Encounter: Payer: Self-pay | Admitting: Gastroenterology

## 2015-08-09 ENCOUNTER — Ambulatory Visit (INDEPENDENT_AMBULATORY_CARE_PROVIDER_SITE_OTHER): Payer: BLUE CROSS/BLUE SHIELD | Admitting: Gastroenterology

## 2015-08-09 ENCOUNTER — Ambulatory Visit: Payer: BLUE CROSS/BLUE SHIELD | Admitting: Gastroenterology

## 2015-08-09 ENCOUNTER — Ambulatory Visit: Payer: BLUE CROSS/BLUE SHIELD | Admitting: Internal Medicine

## 2015-08-09 VITALS — BP 166/78 | HR 67 | Temp 98.6°F | Ht 68.0 in | Wt 181.0 lb

## 2015-08-09 DIAGNOSIS — B182 Chronic viral hepatitis C: Secondary | ICD-10-CM

## 2015-08-09 DIAGNOSIS — C220 Liver cell carcinoma: Secondary | ICD-10-CM | POA: Diagnosis not present

## 2015-08-09 NOTE — Progress Notes (Signed)
Gastroenterology Consultation  Referring Provider:     Roselee Nova, MD Primary Care Physician:  Keith Rake, MD Primary Gastroenterologist:  Dr. Allen Norris     Reason for Consultation:     Hepatitis C with a liver mass        HPI:   Rodney Moody is a 70 y.o. y/o male referred for consultation & management of Hepatitis C with a liver mass by Dr. Keith Rake, MD.  This patient reports that he was treated for hepatitis C back between the year 2000 and 2002. The patient then followed up with Dr. Gustavo Lah after that and has not followed up since. The patient was treated by one of Dr. Marton Redwood partners but then he left and the patient was followed by that practice. The patient reports that he was told that if he continued to liver good lifestyle he should not have any problems from his hepatitis C. The patient now was found to have a liver mass with an alpha-fetoprotein over 1600.  Past Medical History  Diagnosis Date  . GERD (gastroesophageal reflux disease)   . Diabetes mellitus without complication ([place])     borderline Diabetes, but no medication  . Prostate cancer ([place])   . Hepatitis C     Past Surgical History  Procedure Laterality Date  . Left shoulder    . Prostate biopsy    . Colonoscopy w/ biopsies  2014    Patient has polyps-benign    Prior to Admission medications   Medication Sig Start Date End Date Taking? Authorizing Provider  aspirin 81 MG tablet Take 81 mg by mouth daily.   Yes Historical Provider, MD  pantoprazole (PROTONIX) 40 MG tablet TAKE 1 TABLET BY MOUTH EVERY DAY 03/15/15  Yes Roselee Nova, MD    Family History  Problem Relation Age of Onset  . Diabetes Mother   . Heart disease Brother   . Hypertension Brother      Social History  Substance Use Topics  . Smoking status: Former Smoker    Quit date: 02/25/1998  . Smokeless tobacco: Never Used  . Alcohol Use: No    Allergies as of 08/09/2015  . (No Known Allergies)    Review of Systems:      All systems reviewed and negative except where noted in HPI.   Physical Exam:  BP 166/78 mmHg  Pulse 67  Temp(Src) 98.6 F (37 C) (Oral)  Ht 5\' 8"  (1.727 m)  Wt 181 lb (82.101 kg)  BMI 27.53 kg/m2 No LMP for male patient. Psych:  Alert and cooperative. Normal mood and affect. General:   Alert,  Well-developed, well-nourished, pleasant and cooperative in NAD Psych:  Alert and cooperative. Normal mood and affect.  Imaging Studies: Dg Chest 2 View  08/07/2015  CLINICAL DATA:  Pain EXAM: CHEST  2 VIEW COMPARISON:  Sep 13, 2011 FINDINGS: The heart size and mediastinal contours are within normal limits. Both lungs are clear. The visualized skeletal structures are unremarkable. IMPRESSION: No active cardiopulmonary disease. Electronically Signed   By: Dorise Bullion III M.D   On: 08/07/2015 09:18   Mr Abdomen W Wo Contrast  07/27/2015  CLINICAL DATA:  Left hepatic lobe mass. Abnormal liver function tests. Diabetes. Prostate cancer. Gastroesophageal reflux disease. Hepatitis-C. EXAM: MRI ABDOMEN WITHOUT AND WITH CONTRAST TECHNIQUE: Multiplanar multisequence MR imaging of the abdomen was performed both before and after the administration of intravenous contrast. CONTRAST:  61mL MULTIHANCE GADOBENATE DIMEGLUMINE 529 MG/ML IV SOLN  COMPARISON:  06/10/2015 abdominal ultrasound. FINDINGS: Lower chest: Normal heart size without pericardial or pleural effusion. Hepatobiliary: Scattered tiny T2 hyperintense liver lesions are likely cysts. No cirrhosis. Within the lateral segment left liver lobe (segments 2 and 3) is a 7.2 x 5.1 x 4.8 cm relatively ill-defined and infiltrative mass. minimally T2 hyperintense and heterogeneous. T1 hypo intense. No arterial phase post-contrast enhancement. Heterogeneously hypo enhancing on portal venous phase and delayed imaging. Example image 9/series 3, 10/100, and 17/12. Demonstrates extension into the distal left portal vein, including on image 17/ series 12 and image 14/  series 16. Hepatic veins patent. Normal gallbladder, without biliary ductal dilatation. Pancreas: Normal, without mass or ductal dilatation. Spleen: Normal in size, without focal abnormality. Adrenals/Urinary Tract: Normal adrenal glands. Bilateral renal cysts. No suspicious renal lesion or evidence of hydronephrosis. Stomach/Bowel: Proximal gastric underdistention. Apparent wall thickening is likely secondary. Otherwise normal stomach and abdominal bowel loops. Vascular/Lymphatic: Advanced aortic atherosclerosis. Porta hepatis adenopathy, immediately caudal to the hepatic mass. Example 2.1 x 1.9 cm on image 14/series 3. Other: No ascites. Musculoskeletal: No acute osseous abnormality. IMPRESSION: 1. Left hepatic lobe mass with tumor extension into the peripheral most left portal vein. 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 Given lack of arterial phase enhancement and history of a previous primary malignancy, tissue sampling should be considered for confirmation. 2. Adjacent porta hepatis adenopathy is suspicious for localized metastasis. Alternatively, given history of hepatitis-C, this could be reactive. 3. Advanced atherosclerosis. Electronically Signed   By: Abigail Miyamoto M.D.   On: 07/27/2015 09:51    Assessment and Plan:   Rodney Moody is a 70 y.o. y/o male who comes in today with a history of hepatitis C. The patient had been treated the past but appears to not have cleared the virus. The patient is now presenting with a liver mass and an alpha-fetoprotein of 1600. The patient has been told that this likely represents [place]. The patient is not a candidate for treatment at the present time. The patient needs to have his tumor addressed. I have spoken to the oncologist who will see the patient within the next few days to set up a plan for possible resection. The patient has been explained the plan and agrees with it.   Note: This dictation  was prepared with Dragon dictation along with smaller phrase technology. Any transcriptional errors that result from this process are unintentional.

## 2015-08-11 ENCOUNTER — Ambulatory Visit: Payer: BLUE CROSS/BLUE SHIELD

## 2015-08-12 ENCOUNTER — Inpatient Hospital Stay (HOSPITAL_BASED_OUTPATIENT_CLINIC_OR_DEPARTMENT_OTHER): Payer: BLUE CROSS/BLUE SHIELD | Admitting: Internal Medicine

## 2015-08-12 VITALS — BP 150/78 | HR 75 | Temp 97.9°F | Wt 179.9 lb

## 2015-08-12 DIAGNOSIS — R768 Other specified abnormal immunological findings in serum: Secondary | ICD-10-CM

## 2015-08-12 DIAGNOSIS — R894 Abnormal immunological findings in specimens from other organs, systems and tissues: Secondary | ICD-10-CM

## 2015-08-12 DIAGNOSIS — Z7982 Long term (current) use of aspirin: Secondary | ICD-10-CM

## 2015-08-12 DIAGNOSIS — Z79899 Other long term (current) drug therapy: Secondary | ICD-10-CM

## 2015-08-12 DIAGNOSIS — B1920 Unspecified viral hepatitis C without hepatic coma: Secondary | ICD-10-CM

## 2015-08-12 DIAGNOSIS — E119 Type 2 diabetes mellitus without complications: Secondary | ICD-10-CM

## 2015-08-12 DIAGNOSIS — Z923 Personal history of irradiation: Secondary | ICD-10-CM

## 2015-08-12 DIAGNOSIS — R160 Hepatomegaly, not elsewhere classified: Secondary | ICD-10-CM

## 2015-08-12 DIAGNOSIS — R7989 Other specified abnormal findings of blood chemistry: Secondary | ICD-10-CM | POA: Diagnosis not present

## 2015-08-12 DIAGNOSIS — I700 Atherosclerosis of aorta: Secondary | ICD-10-CM

## 2015-08-12 DIAGNOSIS — Z87891 Personal history of nicotine dependence: Secondary | ICD-10-CM

## 2015-08-12 DIAGNOSIS — R599 Enlarged lymph nodes, unspecified: Secondary | ICD-10-CM | POA: Diagnosis not present

## 2015-08-12 DIAGNOSIS — Z8546 Personal history of malignant neoplasm of prostate: Secondary | ICD-10-CM

## 2015-08-12 NOTE — Progress Notes (Signed)
[facility] NOTE  Patient Care Team: Roselee Nova, MD as PCP - General (Family Medicine)  CHIEF COMPLAINTS/PURPOSE OF CONSULTATION:   # March 2017- Hepatocellular ca; AFP- 0932; LEFT LOBE LIVER MASS- 7.2x5.1x4.8cm ? Extension in distal left portal Vein;Porta hepatis adenopathy- 2 .1 x 1.9 cm  # 2002- Hepatitis C s/p ? Interferon injections; 2017 April- Dr.Oh   # 2013-  PROSTATE CANCER [Rad[facility]Wulff; Urology]  HISTORY OF PRESENTING ILLNESS:  Rodney Moody 70 y.o.  male with a history of hepatitis C- is here to review his workup for suspected liver cancer.  Patient interim has met with gastroenterology- who recommends further evaluation/treatment of hepatocellular cancer.  Patient's appetite is good. Denies any weight loss. Denies any leg swelling. No nausea no vomiting. No chest pain. No cough. Denies any abdominal distention.   ROS: A complete 10 point review of system is done which is negative except mentioned above in history of present illness  MEDICAL HISTORY:  Past Medical History  Diagnosis Date  . GERD (gastroesophageal reflux disease)   . Diabetes mellitus without complication ([place])     borderline Diabetes, but no medication  . Prostate cancer ([place])   . Hepatitis C     SURGICAL HISTORY: Past Surgical History  Procedure Laterality Date  . Left shoulder    . Prostate biopsy    . Colonoscopy w/ biopsies  2014    Patient has polyps-benign    SOCIAL HISTORY: Works in [employer]. Lives in [place] and son. No alcohol. No smoking. Social History   Social History  . Marital Status: Married    Spouse Name: N/A  . Number of Children: N/A  . Years of Education: N/A   Occupational History  . Not on file.   Social History Main Topics  . Smoking status: Former Smoker    Quit date: 02/25/1998  . Smokeless tobacco: Never Used  . Alcohol Use: No  . Drug Use: No  . Sexual Activity:    Partners: Female   Other Topics Concern  . Not  on file   Social History Narrative   Married with 1 child          FAMILY HISTORY: Family History  Problem Relation Age of Onset  . Diabetes Mother   . Heart disease Brother   . Hypertension Brother     ALLERGIES:  has No Known Allergies.  MEDICATIONS:  Current Outpatient Prescriptions  Medication Sig Dispense Refill  . aspirin 81 MG tablet Take 81 mg by mouth daily.    . pantoprazole (PROTONIX) 40 MG tablet TAKE 1 TABLET BY MOUTH EVERY DAY 90 tablet 1   No current facility-administered medications for this visit.      Marland Kitchen  PHYSICAL EXAMINATION: ECOG PERFORMANCE STATUS: 0 - Asymptomatic  Filed Vitals:   08/12/15 1339  BP: 150/78  Pulse: 75  Temp: 97.9 F (36.6 C)   Filed Weights   08/12/15 1339  Weight: 179 lb 14.3 oz (81.6 kg)    GENERAL: Well-nourished well-developed; Alert, no distress and comfortable.   Alone.  EYES: no pallor or icterus OROPHARYNX: no thrush or ulceration; good dentition  NECK: supple, no masses felt LYMPH:  no palpable lymphadenopathy in the cervical, axillary or inguinal regions LUNGS: clear to auscultation and  No wheeze or crackles HEART/CVS: regular rate & rhythm and no murmurs; No lower extremity edema ABDOMEN: abdomen soft, non-tender and normal bowel sounds Musculoskeletal:no cyanosis of digits and no clubbing  PSYCH: alert &  oriented x 3 with fluent speech NEURO: no focal motor/sensory deficits SKIN:  no rashes or significant lesions  LABORATORY DATA:  I have reviewed the data as listed Lab Results  Component Value Date   WBC 4.4 08/07/2015   HGB 13.0 08/07/2015   HCT 39.5* 08/07/2015   MCV 82.1 08/07/2015   PLT 147* 08/07/2015    Recent Labs  05/27/15 1203 08/02/15 1604 08/07/15 0907  NA 139 138 136  K 4.5 3.9 3.8  CL 97 105 105  CO2 25 27 26   GLUCOSE 95 117* 97  BUN 18 24* 20  CREATININE 0.97 1.12 1.00  CALCIUM 9.8 9.3 9.1  GFRNONAA 80 >60 >60  GFRAA 92 >60 >60  PROT 7.8 8.2* 7.7  ALBUMIN 4.6 4.0 3.9   AST 41* 58* 58*  ALT 39 56 53  ALKPHOS 61 64 55  BILITOT 0.4 0.3 0.4    RADIOGRAPHIC STUDIES: I have personally reviewed the radiological images as listed and agreed with the findings in the report. Dg Chest 2 View  08/07/2015  CLINICAL DATA:  Pain EXAM: CHEST  2 VIEW COMPARISON:  Sep 13, 2011 FINDINGS: The heart size and mediastinal contours are within normal limits. Both lungs are clear. The visualized skeletal structures are unremarkable. IMPRESSION: No active cardiopulmonary disease. Electronically Signed   By: Dorise Bullion III M.D   On: 08/07/2015 09:18   Mr Abdomen W Wo Contrast  07/27/2015  CLINICAL DATA:  Left hepatic lobe mass. Abnormal liver function tests. Diabetes. Prostate cancer. Gastroesophageal reflux disease. Hepatitis-C. EXAM: MRI ABDOMEN WITHOUT AND WITH CONTRAST TECHNIQUE: Multiplanar multisequence MR imaging of the abdomen was performed both before and after the administration of intravenous contrast. CONTRAST:  36m MULTIHANCE GADOBENATE DIMEGLUMINE 529 MG/ML IV SOLN COMPARISON:  06/10/2015 abdominal ultrasound. FINDINGS: Lower chest: Normal heart size without pericardial or pleural effusion. Hepatobiliary: Scattered tiny T2 hyperintense liver lesions are likely cysts. No cirrhosis. Within the lateral segment left liver lobe (segments 2 and 3) is a 7.2 x 5.1 x 4.8 cm relatively ill-defined and infiltrative mass. minimally T2 hyperintense and heterogeneous. T1 hypo intense. No arterial phase post-contrast enhancement. Heterogeneously hypo enhancing on portal venous phase and delayed imaging. Example image 9/series 3, 10/100, and 17/12. Demonstrates extension into the distal left portal vein, including on image 17/ series 12 and image 14/ series 16. Hepatic veins patent. Normal gallbladder, without biliary ductal dilatation. Pancreas: Normal, without mass or ductal dilatation. Spleen: Normal in size, without focal abnormality. Adrenals/Urinary Tract: Normal adrenal glands.  Bilateral renal cysts. No suspicious renal lesion or evidence of hydronephrosis. Stomach/Bowel: Proximal gastric underdistention. Apparent wall thickening is likely secondary. Otherwise normal stomach and abdominal bowel loops. Vascular/Lymphatic: Advanced aortic atherosclerosis. Porta hepatis adenopathy, immediately caudal to the hepatic mass. Example 2.1 x 1.9 cm on image 14/series 3. Other: No ascites. Musculoskeletal: No acute osseous abnormality. IMPRESSION: 1. Left hepatic lobe mass with tumor extension into the peripheral most left portal vein. 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 Given lack of arterial phase enhancement and history of a previous primary malignancy, tissue sampling should be considered for confirmation. 2. Adjacent porta hepatis adenopathy is suspicious for localized metastasis. Alternatively, given history of hepatitis-C, this could be reactive. 3. Advanced atherosclerosis. Electronically Signed   By: KAbigail MiyamotoM.D.   On: 07/27/2015 09:51    ASSESSMENT & PLAN:   #  Left hepatic lobe mass [7.2x5.1x4.8cm ] with tumor extension into the peripheral left portal vein/in  the context of hepatitis C- highly concerning for hepatocellular cancer. Reviewed the imaging at the tumor conference-; with AFP around 1600- this is most likely suggestive of hepatocellular cancer. Hence tissue diagnosis is not recommended.  # I have recommended to the patient that since a malignancy seems to be limited to the liver- recommend resection. I have contacted liver surgery Department at [facility]; however agreed to evaluate the patient for resection.  # Hepatitis C/ recently followed up by GI.   # Patient will follow-up with me in approximately 4 weeks with CBC CMP and AFP/tentative appointment based upon follow-up in [place]. Patient was asked to call us in a week or so if he has not heard about his appointment.     Cammie Sickle,  MD 08/12/2015 1:54 PM

## 2015-08-12 NOTE — Progress Notes (Signed)
Patient ambulates without assistance.  Vitals documented, patient denies pain or discomfort.  Medication record updated, information provided by patient.

## 2015-08-16 DIAGNOSIS — C220 Liver cell carcinoma: Secondary | ICD-10-CM | POA: Insufficient documentation

## 2015-08-19 ENCOUNTER — Ambulatory Visit: Payer: BLUE CROSS/BLUE SHIELD | Admitting: Internal Medicine

## 2015-09-12 ENCOUNTER — Inpatient Hospital Stay: Payer: BLUE CROSS/BLUE SHIELD | Admitting: Internal Medicine

## 2015-09-12 ENCOUNTER — Inpatient Hospital Stay: Payer: BLUE CROSS/BLUE SHIELD

## 2015-09-20 NOTE — Progress Notes (Signed)
  Oncology Nurse Navigator Documentation  Navigator Location: [facility] (09/20/15 0900) Navigator Encounter Type: Other (09/20/15 0900)       Surgery Date:  (Scheduled 10/06/15) (09/20/15 0900)                                  Time Spent with Patient: 30 (09/20/15 0900)   Scheduled for partial hepatectomy 10/06/15 with Dr Mariah Milling at [facility].

## 2015-09-23 ENCOUNTER — Other Ambulatory Visit: Payer: BLUE CROSS/BLUE SHIELD

## 2015-09-23 ENCOUNTER — Ambulatory Visit: Payer: BLUE CROSS/BLUE SHIELD | Admitting: Internal Medicine

## 2015-09-30 ENCOUNTER — Other Ambulatory Visit: Payer: BLUE CROSS/BLUE SHIELD

## 2015-09-30 ENCOUNTER — Ambulatory Visit: Payer: BLUE CROSS/BLUE SHIELD | Admitting: Internal Medicine

## 2015-10-06 DIAGNOSIS — C220 Liver cell carcinoma: Secondary | ICD-10-CM

## 2015-10-06 HISTORY — DX: Liver cell carcinoma: C22.0

## 2015-11-27 ENCOUNTER — Other Ambulatory Visit: Payer: Self-pay | Admitting: Family Medicine

## 2015-12-16 DIAGNOSIS — M545 Low back pain, unspecified: Secondary | ICD-10-CM | POA: Insufficient documentation

## 2016-02-03 ENCOUNTER — Ambulatory Visit (INDEPENDENT_AMBULATORY_CARE_PROVIDER_SITE_OTHER): Payer: BLUE CROSS/BLUE SHIELD | Admitting: Family Medicine

## 2016-02-03 ENCOUNTER — Encounter: Payer: Self-pay | Admitting: Family Medicine

## 2016-02-03 VITALS — BP 120/78 | HR 68 | Temp 98.2°F | Resp 16 | Ht 68.0 in | Wt 179.2 lb

## 2016-02-03 DIAGNOSIS — B182 Chronic viral hepatitis C: Secondary | ICD-10-CM

## 2016-02-03 DIAGNOSIS — E785 Hyperlipidemia, unspecified: Secondary | ICD-10-CM

## 2016-02-03 NOTE — Progress Notes (Signed)
Name: Rodney Moody   MRN: [id number]    DOB: [birth year]   Date:02/03/2016       Progress Note  Subjective  Chief Complaint  Chief Complaint  Patient presents with  . Hyperlipidemia    follow up    Hyperlipidemia  This is a chronic problem. The problem is controlled. Recent lipid tests were reviewed and are high. Pertinent negatives include no chest pain or shortness of breath. He is currently on no antihyperlipidemic treatment. Risk factors for coronary artery disease include dyslipidemia.    Past Medical History:  Diagnosis Date  . Diabetes mellitus without complication ([place])    borderline Diabetes, but no medication  . GERD (gastroesophageal reflux disease)   . Hepatitis C   . Prostate cancer [facility])     Past Surgical History:  Procedure Laterality Date  . COLONOSCOPY W/ BIOPSIES  2014   Patient has polyps-benign  . left shoulder    . PROSTATE BIOPSY      Family History  Problem Relation Age of Onset  . Diabetes Mother   . Heart disease Brother   . Hypertension Brother     Social History   Social History  . Marital status: Married    Spouse name: N/A  . Number of children: N/A  . Years of education: N/A   Occupational History  . Not on file.   Social History Main Topics  . Smoking status: Former Smoker    Quit date: 02/25/1998  . Smokeless tobacco: Never Used  . Alcohol use No  . Drug use: No  . Sexual activity: Yes    Partners: Female   Other Topics Concern  . Not on file   Social History Narrative   Married with 1 child           Current Outpatient Prescriptions:  .  aspirin 81 MG tablet, Take 81 mg by mouth daily., Disp: , Rfl:  .  pantoprazole (PROTONIX) 40 MG tablet, TAKE 1 TABLET BY MOUTH EVERY DAY, Disp: 90 tablet, Rfl: 0  No Known Allergies   Review of Systems  Respiratory: Negative for cough and shortness of breath.   Cardiovascular: Negative for chest pain.  Gastrointestinal: Negative for abdominal pain, nausea and vomiting.       Objective  Vitals:   02/03/16 1125  BP: 120/78  Pulse: 68  Resp: 16  Temp: 98.2 F (36.8 C)  TempSrc: Oral  SpO2: 98%  Weight: 179 lb 3.2 oz (81.3 kg)  Height: 5\' 8"  (1.727 m)    Physical Exam  Constitutional: He is oriented to person, place, and time and well-developed, well-nourished, and in no distress.  HENT:  Head: Normocephalic and atraumatic.  Cardiovascular: Normal rate, regular rhythm and normal heart sounds.   No murmur heard. Pulmonary/Chest: Effort normal and breath sounds normal.  Neurological: He is alert and oriented to person, place, and time.  Psychiatric: Mood, memory, affect and judgment normal.  Nursing note and vitals reviewed.     Assessment & Plan  1. Hyperlipidemia, unspecified hyperlipidemia type - Lipid Profile - COMPLETE METABOLIC PANEL WITH GFR  2. Chronic hepatitis C without hepatic coma [facility]) Being followed by hepatologist.  Okey Dupre A. [facility] 02/03/2016 11:28 AM

## 2016-02-07 DIAGNOSIS — B182 Chronic viral hepatitis C: Secondary | ICD-10-CM | POA: Insufficient documentation

## 2016-02-07 LAB — COMPLETE METABOLIC PANEL WITH GFR
ALT: 54 U/L — AB (ref 9–46)
AST: 56 U/L — AB (ref 10–35)
Albumin: 3.9 g/dL (ref 3.6–5.1)
Alkaline Phosphatase: 54 U/L (ref 40–115)
BUN: 21 mg/dL (ref 7–25)
CHLORIDE: 103 mmol/L (ref 98–110)
CO2: 29 mmol/L (ref 20–31)
CREATININE: 0.97 mg/dL (ref 0.70–1.25)
Calcium: 9.3 mg/dL (ref 8.6–10.3)
GFR, Est African American: >89 mL/min (ref >=60)
GFR, Est Non African American: 80 mL/min (ref >=60)
Glucose, Bld: 122 mg/dL — ABNORMAL HIGH (ref 65–99)
Potassium: 4.6 mmol/L (ref 3.5–5.3)
Sodium: 139 mmol/L (ref 135–146)
Total Bilirubin: 0.5 mg/dL (ref 0.2–1.2)
Total Protein: 7.1 g/dL (ref 6.1–8.1)

## 2016-02-07 LAB — LIPID PANEL
CHOLESTEROL: 221 mg/dL — AB (ref 125–200)
HDL: 99 mg/dL (ref >=40)
LDL CALC: 106 mg/dL (ref <130)
Total CHOL/HDL Ratio: 2.2 Ratio (ref <=5.0)
Triglycerides: 81 mg/dL (ref <150)
VLDL: 16 mg/dL (ref <30)

## 2016-02-23 ENCOUNTER — Other Ambulatory Visit: Payer: Self-pay | Admitting: Family Medicine

## 2016-04-27 DIAGNOSIS — R768 Other specified abnormal immunological findings in serum: Secondary | ICD-10-CM | POA: Insufficient documentation

## 2016-05-21 ENCOUNTER — Other Ambulatory Visit: Payer: Self-pay | Admitting: Family Medicine

## 2016-05-28 MED ORDER — MIDAZOLAM HCL 5 MG/5ML IJ SOLN
INTRAMUSCULAR | Status: AC
  Filled 2016-05-28: qty 5

## 2016-05-28 MED ORDER — FENTANYL CITRATE (PF) 100 MCG/2ML IJ SOLN
INTRAMUSCULAR | Status: AC
  Filled 2016-05-28: qty 2

## 2016-06-01 ENCOUNTER — Encounter: Payer: Self-pay | Admitting: *Deleted

## 2016-06-04 ENCOUNTER — Encounter: Payer: Self-pay | Admitting: Anesthesiology

## 2016-06-04 ENCOUNTER — Encounter
Admission: RE | Disposition: A | Discharge status: Home / Self Care | Payer: Self-pay | Source: Ambulatory Visit | Attending: Gastroenterology

## 2016-06-04 ENCOUNTER — Ambulatory Visit: Payer: BLUE CROSS/BLUE SHIELD | Admitting: Anesthesiology

## 2016-06-04 ENCOUNTER — Ambulatory Visit
Admission: RE | Admit: 2016-06-04 | Discharge: 2016-06-04 | Disposition: A | Discharge status: Home / Self Care | Payer: BLUE CROSS/BLUE SHIELD | Source: Ambulatory Visit | Attending: Gastroenterology | Admitting: Gastroenterology

## 2016-06-04 DIAGNOSIS — B182 Chronic viral hepatitis C: Secondary | ICD-10-CM | POA: Diagnosis not present

## 2016-06-04 DIAGNOSIS — Z7982 Long term (current) use of aspirin: Secondary | ICD-10-CM | POA: Insufficient documentation

## 2016-06-04 DIAGNOSIS — K7460 Unspecified cirrhosis of liver: Secondary | ICD-10-CM | POA: Insufficient documentation

## 2016-06-04 DIAGNOSIS — R970 Elevated carcinoembryonic antigen [CEA]: Secondary | ICD-10-CM | POA: Diagnosis not present

## 2016-06-04 DIAGNOSIS — Z87891 Personal history of nicotine dependence: Secondary | ICD-10-CM | POA: Diagnosis not present

## 2016-06-04 DIAGNOSIS — Z8546 Personal history of malignant neoplasm of prostate: Secondary | ICD-10-CM | POA: Insufficient documentation

## 2016-06-04 DIAGNOSIS — K2970 Gastritis, unspecified, without bleeding: Secondary | ICD-10-CM | POA: Insufficient documentation

## 2016-06-04 DIAGNOSIS — K219 Gastro-esophageal reflux disease without esophagitis: Secondary | ICD-10-CM | POA: Insufficient documentation

## 2016-06-04 DIAGNOSIS — E119 Type 2 diabetes mellitus without complications: Secondary | ICD-10-CM | POA: Diagnosis not present

## 2016-06-04 DIAGNOSIS — K228 Other specified diseases of esophagus: Secondary | ICD-10-CM | POA: Diagnosis present

## 2016-06-04 HISTORY — PX: ESOPHAGOGASTRODUODENOSCOPY: SHX5428

## 2016-06-04 HISTORY — PX: COLONOSCOPY WITH PROPOFOL: SHX5780

## 2016-06-04 LAB — CBC WITH DIFFERENTIAL/PLATELET
Basophils Absolute: 0 10*3/uL (ref 0–0.1)
Basophils Relative: 1 %
Eosinophils Absolute: 0.1 10*3/uL (ref 0–0.7)
Eosinophils Relative: 2 %
HCT: 38.8 % — ABNORMAL LOW (ref 40.0–52.0)
HEMOGLOBIN: 12.9 g/dL — AB (ref 13.0–18.0)
LYMPHS ABS: 0.9 10*3/uL — AB (ref 1.0–3.6)
Lymphocytes Relative: 20 %
MCH: 25.8 pg — AB (ref 26.0–34.0)
MCHC: 33.3 g/dL (ref 32.0–36.0)
MCV: 77.5 fL — ABNORMAL LOW (ref 80.0–100.0)
MONO ABS: 0.5 10*3/uL (ref 0.2–1.0)
Monocytes Relative: 10 %
NEUTROS ABS: 3.2 10*3/uL (ref 1.4–6.5)
Neutrophils Relative %: 67 %
Platelets: 179 10*3/uL (ref 150–440)
RBC: 5.01 MIL/uL (ref 4.40–5.90)
RDW: 15.2 % — AB (ref 11.5–14.5)
WBC: 4.8 10*3/uL (ref 3.8–10.6)

## 2016-06-04 LAB — PROTIME-INR
INR: 1.02
PROTHROMBIN TIME: 13.4 s (ref 11.4–15.2)

## 2016-06-04 SURGERY — EGD (ESOPHAGOGASTRODUODENOSCOPY)
Anesthesia: General

## 2016-06-04 MED ORDER — FENTANYL CITRATE (PF) 100 MCG/2ML IJ SOLN
INTRAMUSCULAR | Status: AC
  Filled 2016-06-04: qty 2

## 2016-06-04 MED ORDER — MIDAZOLAM HCL 2 MG/2ML IJ SOLN
INTRAMUSCULAR | Status: AC
  Filled 2016-06-04: qty 2

## 2016-06-04 MED ORDER — PROPOFOL 500 MG/50ML IV EMUL
INTRAVENOUS | Status: AC
  Filled 2016-06-04: qty 50

## 2016-06-04 MED ORDER — FENTANYL CITRATE (PF) 100 MCG/2ML IJ SOLN
INTRAMUSCULAR | Status: DC | PRN
  Administered 2016-06-04: 50 ug via INTRAVENOUS

## 2016-06-04 MED ORDER — SODIUM CHLORIDE 0.9 % IV SOLN
INTRAVENOUS | Status: DC
  Administered 2016-06-04 (×2): via INTRAVENOUS

## 2016-06-04 MED ORDER — SODIUM CHLORIDE 0.9 % IV SOLN: INTRAVENOUS | Status: DC

## 2016-06-04 MED ORDER — PROPOFOL 500 MG/50ML IV EMUL
INTRAVENOUS | Status: DC | PRN
  Administered 2016-06-04: 150 ug/kg/min via INTRAVENOUS

## 2016-06-04 MED ORDER — MIDAZOLAM HCL 2 MG/2ML IJ SOLN
INTRAMUSCULAR | Status: DC | PRN
  Administered 2016-06-04: 1 mg via INTRAVENOUS

## 2016-06-04 MED ORDER — PROPOFOL 10 MG/ML IV BOLUS
INTRAVENOUS | Status: DC | PRN
  Administered 2016-06-04: 50 mg via INTRAVENOUS

## 2016-06-04 NOTE — Anesthesia Procedure Notes (Signed)
Date/Time: 06/04/2016 1:10 PM Performed by: Allean Found Pre-anesthesia Checklist: Patient identified, Emergency Drugs available, Suction available, Patient being monitored and Timeout performed Patient Re-evaluated:Patient Re-evaluated prior to inductionOxygen Delivery Method: Nasal cannula Preoxygenation: Pre-oxygenation with 100% oxygen Intubation Type: IV induction

## 2016-06-04 NOTE — Transfer of Care (Signed)
Immediate Anesthesia Transfer of Care Note  Patient: Rodney Moody  Procedure(s) Performed: Procedure(s): ESOPHAGOGASTRODUODENOSCOPY (EGD) (N/A) COLONOSCOPY WITH PROPOFOL (N/A)  Patient Location: PACU  Anesthesia Type:General  Level of Consciousness: sedated  Airway & Oxygen Therapy: Patient Spontanous Breathing and Patient connected to nasal cannula oxygen  Post-op Assessment: Report given to RN and Post -op Vital signs reviewed and stable  Post vital signs: Reviewed and stable  Last Vitals:  Vitals:   06/04/16 1154 06/04/16 1337  BP: 135/75 105/68  Pulse: 67 (!) 52  Resp: 18 (!) 9  Temp: 36.6 C (!) 36.1 C    Last Pain:  Vitals:   06/04/16 1337  TempSrc: Tympanic         Complications: No apparent anesthesia complications

## 2016-06-04 NOTE — Anesthesia Procedure Notes (Signed)
Date/Time: 06/04/2016 1:00 PM Performed by: Allean Found Pre-anesthesia Checklist: Patient identified, Emergency Drugs available, Suction available, Patient being monitored and Timeout performed Patient Re-evaluated:Patient Re-evaluated prior to inductionOxygen Delivery Method: Nasal cannula Intubation Type: IV induction Placement Confirmation: positive ETCO2

## 2016-06-04 NOTE — H&P (Signed)
Outpatient short stay form Pre-procedure 06/04/2016 12:42 PM Lollie Sails MD  Primary Physician: Dr. Charlean Sanfilippo  Reason for visit:  EGD and colonoscopy  History of present illness:  Patient is a 71 year old male presenting today as above. He has a history of hepatocellular carcinoma that was then removed by surgical resection in segmentectomy's. He is presenting today for routine colonoscopy as well as EGD. It has been a number of years since he had an EGD he does have a history of hepatitis C and is currently being treated with Epclusa. He does take 81 mg aspirin says he hasn't taken that for several days. He takes no other blood thinning agents. He does take Protonix daily. His pro time today was 13.4 with an INR of 1.02. His hemogram today shows of 4.8 white count with of hemoglobin and hematocrit of 12.9 and 38.8 respectively platelet count of 179. He tolerated his prep well.    Current Facility-Administered Medications:  .  0.9 %  sodium chloride infusion, , Intravenous, Continuous, Lollie Sails, MD, Last Rate: 50 mL/hr at 06/04/16 1236 .  0.9 %  sodium chloride infusion, , Intravenous, Continuous, Lollie Sails, MD  Prescriptions Prior to Admission  Medication Sig Dispense Refill Last Dose  . aspirin 81 MG tablet Take 81 mg by mouth daily.   Past Week at Unknown time  . pantoprazole (PROTONIX) 40 MG tablet TAKE 1 TABLET BY MOUTH EVERY DAY 90 tablet 0 Past Week at Unknown time  . Sofosbuvir-Velpatasvir (EPCLUSA) 400-100 MG TABS Take 400 mg by mouth 1 day or 1 dose.   06/03/2016 at Unknown time     No Known Allergies   Past Medical History:  Diagnosis Date  . Diabetes mellitus without complication ([place])    borderline Diabetes, but no medication  . GERD (gastroesophageal reflux disease)   . Hepatitis C   . Prostate cancer [facility])     Review of systems:      Physical Exam    Heart and lungs: Regular rate and rhythm without rub or gallop, lungs are bilaterally  clear.    HEENT: Normocephalic atraumatic eyes are anicteric    Other:     Pertinant exam for procedure: Soft nontender nondistended bowel sounds positive normoactive.    Planned proceedures: EGD and colonoscopy with indicated procedures. I have discussed the risks benefits and complications of procedures to include not limited to bleeding, infection, perforation and the risk of sedation and the patient wishes to proceed.    Lollie Sails, MD Gastroenterology 06/04/2016  12:42 PM

## 2016-06-04 NOTE — Anesthesia Preprocedure Evaluation (Signed)
Anesthesia Evaluation  Patient identified by MRN, date of birth, ID band Patient awake    Reviewed: Allergy & Precautions, H&P , NPO status , Patient's Chart, lab work & pertinent test results, reviewed documented beta blocker date and time   History of Anesthesia Complications Negative for: history of anesthetic complications  Airway Mallampati: I  TM Distance: >3 FB Neck ROM: full    Dental  (+) Partial Upper, Missing, Dental Advidsory Given, Partial Lower, Poor Dentition   Pulmonary neg pulmonary ROS, former smoker,           Cardiovascular Exercise Tolerance: Good negative cardio ROS       Neuro/Psych negative neurological ROS  negative psych ROS   GI/Hepatic GERD  ,(+) Hepatitis -, CS/p liver resection for cancer   Endo/Other  diabetes (borderline)  Renal/GU negative Renal ROS  negative genitourinary   Musculoskeletal   Abdominal   Peds  Hematology negative hematology ROS (+)   Anesthesia Other Findings Past Medical History: No date: Diabetes mellitus without complication ([facility])     Comment: borderline Diabetes, but no medication No date: GERD (gastroesophageal reflux disease) No date: Hepatitis C No date: Prostate cancer ([facility])   Reproductive/Obstetrics negative OB ROS                             Anesthesia Physical Anesthesia Plan  ASA: III  Anesthesia Plan: General   Post-op Pain Management:    Induction:   Airway Management Planned:   Additional Equipment:   Intra-op Plan:   Post-operative Plan:   Informed Consent: I have reviewed the patients History and Physical, chart, labs and discussed the procedure including the risks, benefits and alternatives for the proposed anesthesia with the patient or authorized representative who has indicated his/her understanding and acceptance.   Dental Advisory Given  Plan Discussed with: Anesthesiologist, CRNA and  Surgeon  Anesthesia Plan Comments:         Anesthesia Quick Evaluation

## 2016-06-04 NOTE — Op Note (Signed)
[facility] Gastroenterology Patient Name: Rodney Moody Procedure Date: 06/04/2016 12:56 PM MRN: [id number] Account #: [id number] Date of Birth: 08-06-1945 Admit Type: Outpatient Age: 71 Room: [facility] ENDO ROOM 3 Gender: Male Note Status: Finalized Procedure:            Upper GI endoscopy Indications:          cirrhosis, chronic hepatitis C, recent hepatic                        segmentectomy for hepatocellular carcinoma. Providers:            Lollie Sails, MD Referring MD:         Otila Back Manuella Ghazi (Referring MD) Medicines:            Monitored Anesthesia Care Complications:        No immediate complications. Procedure:            Pre-Anesthesia Assessment:                       - ASA Grade Assessment: III - A patient with severe                        systemic disease.                       After obtaining informed consent, the endoscope was                        passed under direct vision. Throughout the procedure,                        the patient's blood pressure, pulse, and oxygen                        saturations were monitored continuously. The Endoscope                        was introduced through the mouth, and advanced to the                        third part of duodenum. The upper GI endoscopy was                        accomplished without difficulty. The patient tolerated                        the procedure well. Findings:      The Z-line was variable. Biopsies were taken with a cold forceps for       histology. No evidence of varices.      The exam of the esophagus was otherwise normal.      Diffuse and patchy mild inflammation characterized by congestion (edema)       and erythema was found in the gastric body. Biopsies were taken with a       cold forceps for histology. Biopsies were taken with a cold forceps for       Helicobacter pylori testing. No evidence of portal gastropathy.      A single 3 mm mucosal papule (nodule) with no bleeding  and no stigmata       of recent bleeding was found on the  anterior wall of the gastric body.       Biopsies/removal were taken with a cold forceps for histology.      The exam of the stomach was otherwise normal.      The examined duodenum was normal. Impression:           - Z-line variable. Biopsied.                       - Gastritis. Biopsied.                       - A single mucosal papule (nodule) found in the                        stomach. Biopsied.                       - Normal examined duodenum. Recommendation:       - Return to GI clinic in 3 weeks. Procedure Code(s):    --- Professional ---                       [phone], Esophagogastroduodenoscopy, flexible, transoral;                        with biopsy, single or multiple Diagnosis Code(s):    --- Professional ---                       K22.8, Other specified diseases of esophagus                       K29.70, Gastritis, unspecified, without bleeding                       K31.89, Other diseases of stomach and duodenum CPT copyright 2016 American Medical Association. All rights reserved. The codes documented in this report are preliminary and upon coder review may  be revised to meet current compliance requirements. Lollie Sails, MD 06/04/2016 1:22:38 PM This report has been signed electronically. Number of Addenda: 0 Note Initiated On: 06/04/2016 12:56 PM      [facility]

## 2016-06-04 NOTE — Op Note (Signed)
[facility] Gastroenterology Patient Name: Rodney Moody Procedure Date: 06/04/2016 12:58 PM MRN: [id number] Account #: [id number] Date of Birth: 01-05-1946 Admit Type: Outpatient Age: 71 Room: [facility] ENDO ROOM 3 Gender: Male Note Status: Finalized Procedure:            Colonoscopy Indications:          elevated CEA Providers:            Lollie Sails, MD Referring MD:         Otila Back. Manuella Ghazi (Referring MD) Complications:        No immediate complications. Procedure:            Pre-Anesthesia Assessment:                       - ASA Grade Assessment: III - A patient with severe                        systemic disease.                       After obtaining informed consent, the colonoscope was                        passed under direct vision. Throughout the procedure,                        the patient's blood pressure, pulse, and oxygen                        saturations were monitored continuously. The                        Colonoscope was introduced through the anus with the                        intention of advancing to the cecum. The scope was                        advanced to the sigmoid colon before the procedure was                        aborted. Medications were given. The colonoscopy was                        extremely difficult due to poor endoscopic                        visualization. Findings:      the scope was introduced to about 30--35 cm from the anal verge. The       prep seemes to be quite greasy, coating the lens and making poor       visibility. This could not be imptoved despite multiple rinses. Impression:           - No specimens collected. Recommendation:       - Discharge patient to home.                       - repeat prep and reschedule. Procedure Code(s):    --- Professional ---  [id number], 53, Colonoscopy, flexible; diagnostic, including                        collection of specimen(s) by brushing or washing,  when                        performed (separate procedure) CPT copyright 2016 American Medical Association. All rights reserved. The codes documented in this report are preliminary and upon coder review may  be revised to meet current compliance requirements. Lollie Sails, MD 06/04/2016 1:36:24 PM This report has been signed electronically. Number of Addenda: 0 Note Initiated On: 06/04/2016 12:58 PM Total Procedure Duration: 0 hours 4 minutes 18 seconds       [facility]

## 2016-06-04 NOTE — Anesthesia Post-op Follow-up Note (Cosign Needed)
Anesthesia QCDR form completed.        

## 2016-06-05 ENCOUNTER — Encounter: Payer: Self-pay | Admitting: Gastroenterology

## 2016-06-05 LAB — SURGICAL PATHOLOGY

## 2016-06-05 NOTE — Anesthesia Postprocedure Evaluation (Signed)
Anesthesia Post Note  Patient: Pamala Duffel  Procedure(s) Performed: Procedure(s) (LRB): ESOPHAGOGASTRODUODENOSCOPY (EGD) (N/A) COLONOSCOPY WITH PROPOFOL (N/A)  Patient location during evaluation: Endoscopy Anesthesia Type: General Level of consciousness: awake and alert Pain management: pain level controlled Vital Signs Assessment: post-procedure vital signs reviewed and stable Respiratory status: spontaneous breathing, nonlabored ventilation, respiratory function stable and patient connected to nasal cannula oxygen Cardiovascular status: blood pressure returned to baseline and stable Postop Assessment: no signs of nausea or vomiting Anesthetic complications: no     Last Vitals:  Vitals:   06/04/16 1407 06/04/16 1416  BP: (!) 147/85 (!) 163/78  Pulse: (!) 58 (!) 48  Resp: 10 10  Temp:      Last Pain:  Vitals:   06/04/16 1337  TempSrc: Tympanic                 Martha Clan

## 2016-08-17 ENCOUNTER — Other Ambulatory Visit: Payer: Self-pay | Admitting: Family Medicine

## 2016-08-17 NOTE — Telephone Encounter (Signed)
I sent refill for 30 days, but a caution given that pantoprazole may interfere with the Epclusa Just wanted to bring that up, not sure if other treatment for GERD has been used Leaving for primary

## 2016-09-22 ENCOUNTER — Other Ambulatory Visit: Payer: Self-pay | Admitting: Family Medicine

## 2016-10-08 ENCOUNTER — Encounter: Payer: Self-pay | Admitting: *Deleted

## 2016-10-09 ENCOUNTER — Encounter: Admission: RE | Payer: Self-pay | Source: Ambulatory Visit

## 2016-10-09 ENCOUNTER — Ambulatory Visit
Admission: RE | Admit: 2016-10-09 | Payer: BLUE CROSS/BLUE SHIELD | Source: Ambulatory Visit | Admitting: Gastroenterology

## 2016-10-09 HISTORY — DX: Unspecified viral hepatitis B without hepatic coma: B19.10

## 2016-10-09 SURGERY — COLONOSCOPY WITH PROPOFOL
Anesthesia: General

## 2016-11-08 ENCOUNTER — Ambulatory Visit (INDEPENDENT_AMBULATORY_CARE_PROVIDER_SITE_OTHER): Payer: BLUE CROSS/BLUE SHIELD | Admitting: Family Medicine

## 2016-11-08 ENCOUNTER — Encounter: Payer: Self-pay | Admitting: Family Medicine

## 2016-11-08 VITALS — BP 139/76 | HR 69 | Temp 97.8°F | Resp 16 | Ht 68.0 in | Wt 172.7 lb

## 2016-11-08 DIAGNOSIS — E119 Type 2 diabetes mellitus without complications: Secondary | ICD-10-CM

## 2016-11-08 DIAGNOSIS — E7800 Pure hypercholesterolemia, unspecified: Secondary | ICD-10-CM | POA: Diagnosis not present

## 2016-11-08 DIAGNOSIS — C220 Liver cell carcinoma: Secondary | ICD-10-CM | POA: Diagnosis not present

## 2016-11-08 LAB — LIPID PANEL
CHOL/HDL RATIO: 2.1 ratio (ref <5.0)
CHOLESTEROL: 172 mg/dL (ref <200)
HDL: 82 mg/dL (ref >40)
LDL Cholesterol: 76 mg/dL (ref <100)
TRIGLYCERIDES: 71 mg/dL (ref <150)
VLDL: 14 mg/dL (ref <30)

## 2016-11-08 LAB — COMPLETE METABOLIC PANEL WITH GFR
ALK PHOS: 44 U/L (ref 40–115)
ALT: 14 U/L (ref 9–46)
AST: 18 U/L (ref 10–35)
Albumin: 4 g/dL (ref 3.6–5.1)
BUN: 18 mg/dL (ref 7–25)
CALCIUM: 10.2 mg/dL (ref 8.6–10.3)
CHLORIDE: 101 mmol/L (ref 98–110)
CO2: 29 mmol/L (ref 20–31)
Creat: 0.74 mg/dL (ref 0.70–1.25)
Glucose, Bld: 106 mg/dL — ABNORMAL HIGH (ref 65–99)
POTASSIUM: 4.9 mmol/L (ref 3.5–5.3)
Sodium: 137 mmol/L (ref 135–146)
Total Bilirubin: 0.4 mg/dL (ref 0.2–1.2)
Total Protein: 7.2 g/dL (ref 6.1–8.1)

## 2016-11-08 LAB — POCT GLYCOSYLATED HEMOGLOBIN (HGB A1C): Hemoglobin A1C: 7

## 2016-11-08 LAB — GLUCOSE, POCT (MANUAL RESULT ENTRY): POC Glucose: 98 mg/dl (ref 70–99)

## 2016-11-08 MED ORDER — METFORMIN HCL 500 MG PO TABS: 500.0000 mg | ORAL_TABLET | Freq: Two times a day (BID) | ORAL | 0 refills | Status: DC

## 2016-11-08 NOTE — Progress Notes (Signed)
Name: Rodney Moody   MRN: [id number]    DOB: 1946-03-29   Date:11/08/2016       Progress Note  Subjective  Chief Complaint  Chief Complaint  Patient presents with  . Follow-up    DM  . Diabetes    Diabetes  He presents for his follow-up diabetic visit. He has type 2 diabetes mellitus. His disease course has been stable. There are no hypoglycemic associated symptoms. Pertinent negatives for diabetes include no fatigue, no foot paresthesias, no polydipsia and no polyuria. Pertinent negatives for diabetic complications include no CVA, heart disease or peripheral neuropathy. Current diabetic treatment includes diet. His weight is stable. He is following a diabetic and generally healthy diet. He monitors blood glucose at home 1-2 x per week. His breakfast blood glucose range is generally 140-180 mg/dl. An ACE inhibitor/angiotensin II receptor blocker is not being taken.  Hyperlipidemia  This is a chronic problem. The problem is uncontrolled. Recent lipid tests were reviewed and are high. Exacerbating diseases include diabetes. He is currently on no antihyperlipidemic treatment. Risk factors for coronary artery disease include diabetes mellitus and dyslipidemia.      Past Medical History:  Diagnosis Date  . Diabetes mellitus without complication ([place])    borderline Diabetes, but no medication  . GERD (gastroesophageal reflux disease)   . Hepatitis B   . Hepatitis C   . Prostate cancer [facility])     Past Surgical History:  Procedure Laterality Date  . COLONOSCOPY W/ BIOPSIES  2014   Patient has polyps-benign  . COLONOSCOPY WITH PROPOFOL N/A 06/04/2016   Procedure: COLONOSCOPY WITH PROPOFOL;  Surgeon: Lollie Sails, MD;  Location: [facility] ENDOSCOPY;  Service: Endoscopy;  Laterality: N/A;  . ESOPHAGOGASTRODUODENOSCOPY N/A 06/04/2016   Procedure: ESOPHAGOGASTRODUODENOSCOPY (EGD);  Surgeon: Lollie Sails, MD;  Location: [facility] ENDOSCOPY;  Service: Endoscopy;  Laterality: N/A;  . laparoscopic  resection of liver    . left shoulder    . PROSTATE BIOPSY      Family History  Problem Relation Age of Onset  . Diabetes Mother   . Heart disease Brother   . Hypertension Brother     Social History   Social History  . Marital status: Married    Spouse name: N/A  . Number of children: N/A  . Years of education: N/A   Occupational History  . Not on file.   Social History Main Topics  . Smoking status: Former Smoker    Quit date: 02/25/1998  . Smokeless tobacco: Never Used  . Alcohol use No     Comment: quit in 1999  was not a heavy drinker  . 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 Sexual activity: Yes    Partners: Female   Other Topics Concern  . Not on file   Social History Narrative   Married with 1 child           Current Outpatient Prescriptions:  .  aspirin 81 MG tablet, Take 81 mg by mouth daily., Disp: , Rfl:  .  pantoprazole (PROTONIX) 40 MG tablet, TAKE 1 TABLET BY MOUTH EVERY DAY, Disp: 30 tablet, Rfl: 0 .  Sofosbuvir-Velpatasvir (EPCLUSA) 400-100 MG TABS, Take 400 mg by mouth 1 day or 1 dose., Disp: , Rfl:   No Known Allergies   Review of Systems  Constitutional: Negative for fatigue.  Endo/Heme/Allergies: Negative for polydipsia.     Objective  Vitals:   11/08/16 0821  BP:  139/76  Pulse: 69  Resp: 16  Temp: 97.8 F (36.6 C)  TempSrc: Oral  SpO2: 97%  Weight: 172 lb 11.2 oz (78.3 kg)  Height: 5\' 8"  (1.727 m)    Physical Exam  Constitutional: He is oriented to person, place, and time and well-developed, well-nourished, and in no distress.  HENT:  Head: Normocephalic and atraumatic.  Cardiovascular: Normal rate, regular rhythm and normal heart sounds.   No murmur heard. Pulmonary/Chest: Effort normal and breath sounds normal.  Abdominal: Soft. Bowel sounds are normal. There is no tenderness.  Musculoskeletal: He exhibits no edema.  Neurological: He is alert and oriented to person, place, and time.   Psychiatric: Mood, memory, affect and judgment normal.  Nursing note and vitals reviewed.      Recent Results (from the past 2160 hour(s))  POCT HgB A1C     Status: Abnormal   Collection Time: 11/08/16  8:29 AM  Result Value Ref Range   Hemoglobin A1C 7.0   POCT Glucose (CBG)     Status: Normal   Collection Time: 11/08/16  8:30 AM  Result Value Ref Range   POC Glucose 98 70 - 99 mg/dl     Assessment & Plan  1. Controlled type 2 diabetes mellitus without complication, without long-term current use of insulin (HCC)   A1c 7.0%, well-controlled diabetes, will start on metformin 500 mg twice a day, pain urine microalbumin - POCT HgB A1C - POCT Glucose (CBG) - Urine Microalbumin w/creat. ratio - metFORMIN (GLUCOPHAGE) 500 MG tablet; Take 1 tablet (500 mg total) by mouth 2 (two) times daily with a meal.  Dispense: 180 tablet; Refill: 0  2. Pure hypercholesterolemia Obtain FLP, patient is not a statin candidate because of history of hepatitis C and hepatocellular carcinoma, - Lipid panel - COMPLETE METABOLIC PANEL WITH GFR  3. Hepatocellular carcinoma [facility]) Reviewed notes in detail from [place] 11/08/2016 8:32 AM

## 2016-11-09 LAB — MICROALBUMIN / CREATININE URINE RATIO
CREATININE, URINE: 95 mg/dL (ref 20–370)
MICROALB UR: 0.7 mg/dL
Microalb Creat Ratio: 7 mcg/mg creat (ref <30)

## 2016-12-06 ENCOUNTER — Other Ambulatory Visit: Payer: Self-pay | Admitting: Family Medicine

## 2016-12-14 ENCOUNTER — Encounter: Payer: Self-pay | Admitting: Family Medicine

## 2016-12-14 ENCOUNTER — Ambulatory Visit (INDEPENDENT_AMBULATORY_CARE_PROVIDER_SITE_OTHER): Payer: BLUE CROSS/BLUE SHIELD | Admitting: Family Medicine

## 2016-12-14 VITALS — BP 132/71 | HR 61 | Temp 98.0°F | Resp 16 | Ht 68.0 in | Wt 168.1 lb

## 2016-12-14 DIAGNOSIS — E785 Hyperlipidemia, unspecified: Secondary | ICD-10-CM

## 2016-12-14 DIAGNOSIS — K219 Gastro-esophageal reflux disease without esophagitis: Secondary | ICD-10-CM

## 2016-12-14 MED ORDER — PANTOPRAZOLE SODIUM 40 MG PO TBEC: 40.0000 mg | DELAYED_RELEASE_TABLET | Freq: Every day | ORAL | 0 refills | Status: DC

## 2016-12-14 NOTE — Progress Notes (Signed)
Name: Rodney Moody   MRN: [id number]    DOB: Apr 15, 1946   Date:12/14/2016       Progress Note  Subjective  Chief Complaint  Chief Complaint  Patient presents with  . Follow-up    1 mo / new cholesterol medication     Gastroesophageal Reflux  He reports no belching or no heartburn. This is a chronic problem. The symptoms are aggravated by certain foods (orange juice). He has tried a PPI for the symptoms.     Past Medical History:  Diagnosis Date  . Diabetes mellitus without complication ([place])    borderline Diabetes, but no medication  . GERD (gastroesophageal reflux disease)   . Hepatitis B   . Hepatitis C   . Prostate cancer [facility])     Past Surgical History:  Procedure Laterality Date  . COLONOSCOPY W/ BIOPSIES  2014   Patient has polyps-benign  . COLONOSCOPY WITH PROPOFOL N/A 06/04/2016   Procedure: COLONOSCOPY WITH PROPOFOL;  Surgeon: Lollie Sails, MD;  Location: [facility] ENDOSCOPY;  Service: Endoscopy;  Laterality: N/A;  . ESOPHAGOGASTRODUODENOSCOPY N/A 06/04/2016   Procedure: ESOPHAGOGASTRODUODENOSCOPY (EGD);  Surgeon: Lollie Sails, MD;  Location: [facility] ENDOSCOPY;  Service: Endoscopy;  Laterality: N/A;  . laparoscopic resection of liver    . left shoulder    . PROSTATE BIOPSY      Family History  Problem Relation Age of Onset  . Diabetes Mother   . Heart disease Brother   . Hypertension Brother     Social History   Social History  . Marital status: Married    Spouse name: N/A  . Number of children: N/A  . Years of education: N/A   Occupational History  . Not on file.   Social History Main Topics  . Smoking status: Former Smoker    Quit date: 02/25/1998  . Smokeless tobacco: Never Used  . Alcohol use No     Comment: quit in 1999  was not a heavy drinker  . 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 Sexual activity: Yes    Partners: Female   Other Topics Concern  . Not on file   Social History Narrative   Married with 1  child           Current Outpatient Prescriptions:  .  aspirin 81 MG tablet, Take 81 mg by mouth daily., Disp: , Rfl:  .  metFORMIN (GLUCOPHAGE) 500 MG tablet, Take 1 tablet (500 mg total) by mouth 2 (two) times daily with a meal., Disp: 180 tablet, Rfl: 0 .  pantoprazole (PROTONIX) 40 MG tablet, TAKE 1 TABLET BY MOUTH EVERY DAY, Disp: 30 tablet, Rfl: 0 .  Sofosbuvir-Velpatasvir (EPCLUSA) 400-100 MG TABS, Take 400 mg by mouth 1 day or 1 dose., Disp: , Rfl:   No Known Allergies   Review of Systems  Gastrointestinal: Negative for heartburn.      Objective  Vitals:   12/14/16 1104  BP: 132/71  Pulse: 61  Resp: 16  Temp: 98 F (36.7 C)  TempSrc: Oral  SpO2: 98%  Weight: 168 lb 1.6 oz (76.2 kg)  Height: 5\' 8"  (1.727 m)    Physical Exam  Constitutional: He is oriented to person, place, and time and well-developed, well-nourished, and in no distress.  Cardiovascular: Normal rate, regular rhythm and normal heart sounds.   No murmur heard. Pulmonary/Chest: Effort normal and breath sounds normal. He has no wheezes.  Abdominal: Soft. Bowel sounds are  normal.  Neurological: He is alert and oriented to person, place, and time.  Psychiatric: Mood, memory, affect and judgment normal.  Nursing note and vitals reviewed.     Assessment & Plan  1. Gastroesophageal reflux disease, esophagitis presence not specified Symptoms stable on PPI, refills provided - pantoprazole (PROTONIX) 40 MG tablet; Take 1 tablet (40 mg total) by mouth daily.  Dispense: 90 tablet; Refill: 0  2. Dyslipidemia Patient is a candidate for statin therapy but he must obtain clearance from his liver specialist as prior to initiation, will check with the specialist at his next appointment   [facility] 12/14/2016 11:23 AM

## 2016-12-28 DIAGNOSIS — E0590 Thyrotoxicosis, unspecified without thyrotoxic crisis or storm: Secondary | ICD-10-CM | POA: Insufficient documentation

## 2017-01-03 ENCOUNTER — Emergency Department
Admission: EM | Admit: 2017-01-03 | Discharge: 2017-01-03 | Disposition: A | Discharge status: Home / Self Care | Payer: BLUE CROSS/BLUE SHIELD | Attending: Emergency Medicine | Admitting: Emergency Medicine

## 2017-01-03 ENCOUNTER — Encounter: Payer: Self-pay | Admitting: Emergency Medicine

## 2017-01-03 DIAGNOSIS — Z79899 Other long term (current) drug therapy: Secondary | ICD-10-CM | POA: Diagnosis not present

## 2017-01-03 DIAGNOSIS — Z7984 Long term (current) use of oral hypoglycemic drugs: Secondary | ICD-10-CM | POA: Insufficient documentation

## 2017-01-03 DIAGNOSIS — Z7982 Long term (current) use of aspirin: Secondary | ICD-10-CM | POA: Diagnosis not present

## 2017-01-03 DIAGNOSIS — E1165 Type 2 diabetes mellitus with hyperglycemia: Secondary | ICD-10-CM | POA: Insufficient documentation

## 2017-01-03 DIAGNOSIS — R739 Hyperglycemia, unspecified: Secondary | ICD-10-CM

## 2017-01-03 LAB — URINALYSIS, COMPLETE (UACMP) WITH MICROSCOPIC
BILIRUBIN URINE: NEGATIVE
Bacteria, UA: NONE SEEN
Glucose, UA: >=500 mg/dL — AB
Ketones, ur: 5 mg/dL — AB
Leukocytes, UA: NEGATIVE
NITRITE: NEGATIVE
PROTEIN: NEGATIVE mg/dL
Specific Gravity, Urine: 1.025 (ref 1.005–1.030)
Squamous Epithelial / LPF: NONE SEEN
pH: 5 (ref 5.0–8.0)

## 2017-01-03 LAB — BASIC METABOLIC PANEL
ANION GAP: 9 (ref 5–15)
BUN: 33 mg/dL — ABNORMAL HIGH (ref 6–20)
CALCIUM: 9.3 mg/dL (ref 8.9–10.3)
CO2: 27 mmol/L (ref 22–32)
Chloride: 91 mmol/L — ABNORMAL LOW (ref 101–111)
Creatinine, Ser: 1.14 mg/dL (ref 0.61–1.24)
Glucose, Bld: 355 mg/dL — ABNORMAL HIGH (ref 65–99)
POTASSIUM: 4.5 mmol/L (ref 3.5–5.1)
Sodium: 127 mmol/L — ABNORMAL LOW (ref 135–145)

## 2017-01-03 LAB — CBC
HEMATOCRIT: 35 % — AB (ref 40.0–52.0)
HEMOGLOBIN: 12.1 g/dL — AB (ref 13.0–18.0)
MCH: 27.1 pg (ref 26.0–34.0)
MCHC: 34.7 g/dL (ref 32.0–36.0)
MCV: 78.1 fL — ABNORMAL LOW (ref 80.0–100.0)
Platelets: 340 10*3/uL (ref 150–440)
RBC: 4.48 MIL/uL (ref 4.40–5.90)
RDW: 15.8 % — ABNORMAL HIGH (ref 11.5–14.5)
WBC: 6.7 10*3/uL (ref 3.8–10.6)

## 2017-01-03 LAB — GLUCOSE, CAPILLARY
GLUCOSE-CAPILLARY: 294 mg/dL — AB (ref 65–99)
Glucose-Capillary: 340 mg/dL — ABNORMAL HIGH (ref 65–99)

## 2017-01-03 MED ORDER — METFORMIN HCL 500 MG PO TABS: 500.0000 mg | ORAL_TABLET | Freq: Two times a day (BID) | ORAL | 1 refills | Status: DC

## 2017-01-03 MED ORDER — SODIUM CHLORIDE 0.9 % IV BOLUS (SEPSIS)
1000.0000 mL | Freq: Once | INTRAVENOUS | Status: AC
  Administered 2017-01-03: 1000 mL via INTRAVENOUS

## 2017-01-03 NOTE — ED Notes (Signed)
fsbs 294

## 2017-01-03 NOTE — ED Triage Notes (Signed)
Patient states that he is a diet controlled diabetic and has not been eating healthy over the weekend. Patient states that his blood sugar was over 600 on Tuesday. patient states that today it was 480.

## 2017-01-03 NOTE — ED Provider Notes (Signed)
[facility] Emergency Department Provider Note  Time seen: 8:46 PM  I have reviewed the triage vital signs and the nursing notes.   HISTORY  Chief Complaint Hyperglycemia    HPI Rodney Moody is a 71 y.o. male With a past medical history of diet-controlled diabetes, hepatitis, presents to the emergency department for elevated blood glucose. According to the patient he had a reunion over the weekend states he was eating excessively and drinking sweet drinks. He states since that time he has been urinating very frequently checked his blood glucose that was greater than 600 so he came to the emergency department.patient states normally he is able to maintain a good diet and drink plenty of non-sugary fluids to control his blood sugars in the 1 to 200s. Patient denies any abdominal pain, nausea or vomiting. States he was feeling like he had a dry mouth and was urinating frequently.  Past Medical History:  Diagnosis Date  . Diabetes mellitus without complication ([place])    borderline Diabetes, but no medication  . GERD (gastroesophageal reflux disease)   . Hepatitis B   . Hepatitis C   . Prostate cancer [facility])     Patient Active Problem List   Diagnosis Date Noted  . Mass of left lobe of liver 07/01/2015  . Hepatitis C virus infection without hepatic coma 07/01/2015  . Elevated liver enzymes 03/18/2015  . GERD (gastroesophageal reflux disease) 11/19/2014  . Diabetes mellitus type 2, controlled, without complications ([place]) 10/93/2355  . Prostate cancer ([place]) 11/19/2014  . Dyslipidemia 11/19/2014    Past Surgical History:  Procedure Laterality Date  . COLONOSCOPY W/ BIOPSIES  2014   Patient has polyps-benign  . COLONOSCOPY WITH PROPOFOL N/A 06/04/2016   Procedure: COLONOSCOPY WITH PROPOFOL;  Surgeon: Lollie Sails, MD;  [facility] ENDOSCOPY;  Service: Endoscopy;  Laterality: N/A;  . ESOPHAGOGASTRODUODENOSCOPY N/A 06/04/2016   Procedure:  ESOPHAGOGASTRODUODENOSCOPY (EGD);  Surgeon: Lollie Sails, MD;  Location: [facility] ENDOSCOPY;  Service: Endoscopy;  Laterality: N/A;  . laparoscopic resection of liver    . left shoulder    . PROSTATE BIOPSY      Prior to Admission medications   Medication Sig Start Date End Date Taking? Authorizing Provider  aspirin 81 MG tablet Take 81 mg by mouth daily.    [provider]  metFORMIN (GLUCOPHAGE) 500 MG tablet Take 1 tablet (500 mg total) by mouth 2 (two) times daily with a meal. 11/08/16   Roselee Nova, MD  pantoprazole (PROTONIX) 40 MG tablet Take 1 tablet (40 mg total) by mouth daily. 12/14/16   Roselee Nova, MD    No Known Allergies  Family History  Problem Relation Age of Onset  . Diabetes Mother   . Heart disease Brother   . Hypertension Brother     Social History Social History  Substance Use Topics  . Smoking status: Former Smoker    Quit date: 02/25/1998  . Smokeless tobacco: Never Used  . Alcohol use No     Comment: quit in 1999  was not a heavy drinker    Review of Systems Constitutional: Negative for fever. Cardiovascular: Negative for chest pain. Respiratory: Negative for shortness of breath. Gastrointestinal: Negative for abdominal pain, vomiting and diarrhea. Genitourinary: Negative for dysuria.positive for frequency. Musculoskeletal: Negative for back pain Neurological: Negative for headache All other ROS negative  ____________________________________________   PHYSICAL EXAM:  VITAL SIGNS: ED Triage Vitals [01/03/17 1903]  Enc Vitals Group  BP (!) 133/93     Pulse Rate 69     Resp 18     Temp 97.9 F (36.6 C)     Temp Source Oral     SpO2 100 %     Weight 164 lb (74.4 kg)     Height 5\' 8"  (1.727 m)     Head Circumference      Peak Flow      Pain Score      Pain Loc      Pain Edu?      Excl. in [place]?     Constitutional: Alert and oriented. Well appearing and in no distress. Eyes: Normal exam ENT   Head:  Normocephalic and atraumatic.   Nose: No congestion/rhinnorhea.   Mouth/Throat: Mucous membranes are moist. Cardiovascular: Normal rate, regular rhythm. No murmur Respiratory: Normal respiratory effort without tachypnea nor retractions. Breath sounds are clear  Gastrointestinal: Soft and nontender. No distention.  Musculoskeletal: Nontender with normal range of motion in all extremities.  Neurologic:  Normal speech and language. No gross focal neurologic deficits Skin:  Skin is warm, dry and intact.  Psychiatric: Mood and affect are normal.   ____________________________________________   INITIAL IMPRESSION / ASSESSMENT AND PLAN / ED COURSE  Pertinent labs & imaging results that were available during my care of the patient were reviewed by me and considered in my medical decision making (see chart for details).  the patient presents to the emergency department for hyperglycemia greater than 600 2 days ago, in the upper 400s today.patient was given IV fluids, blood sugar now less than 300. Patient's labs are otherwise at baseline. I discussed with the patient that he likely will need to start metformin 500 mg twice daily. He is hoping to try to control it with his diet alone. I discussed with him a trial of diet control for his diabetes over the next 2 days however if at any time his blood sugar reads over 400 he is to start the metformin, or if his blood sugar is not below 200 by the end of the next 2 days he needs to start his metformin. Patient is agreeable to this plan. States he will follow-up with his doctor next week. I discussed return precautions for any signs of dehydration continued frequent urination abdominal pain or vomiting.  ____________________________________________   FINAL CLINICAL IMPRESSION(S) / ED DIAGNOSES  hyperglycemia    Harvest Dark, MD 01/03/17 2050

## 2017-01-08 IMAGING — CR DG CHEST 2V
2 series · 2 of 2 positions shown · non-contrast
Comparison: September 13, 2011

CLINICAL DATA: Pain

EXAM:
CHEST  2 VIEW

[chest pa]
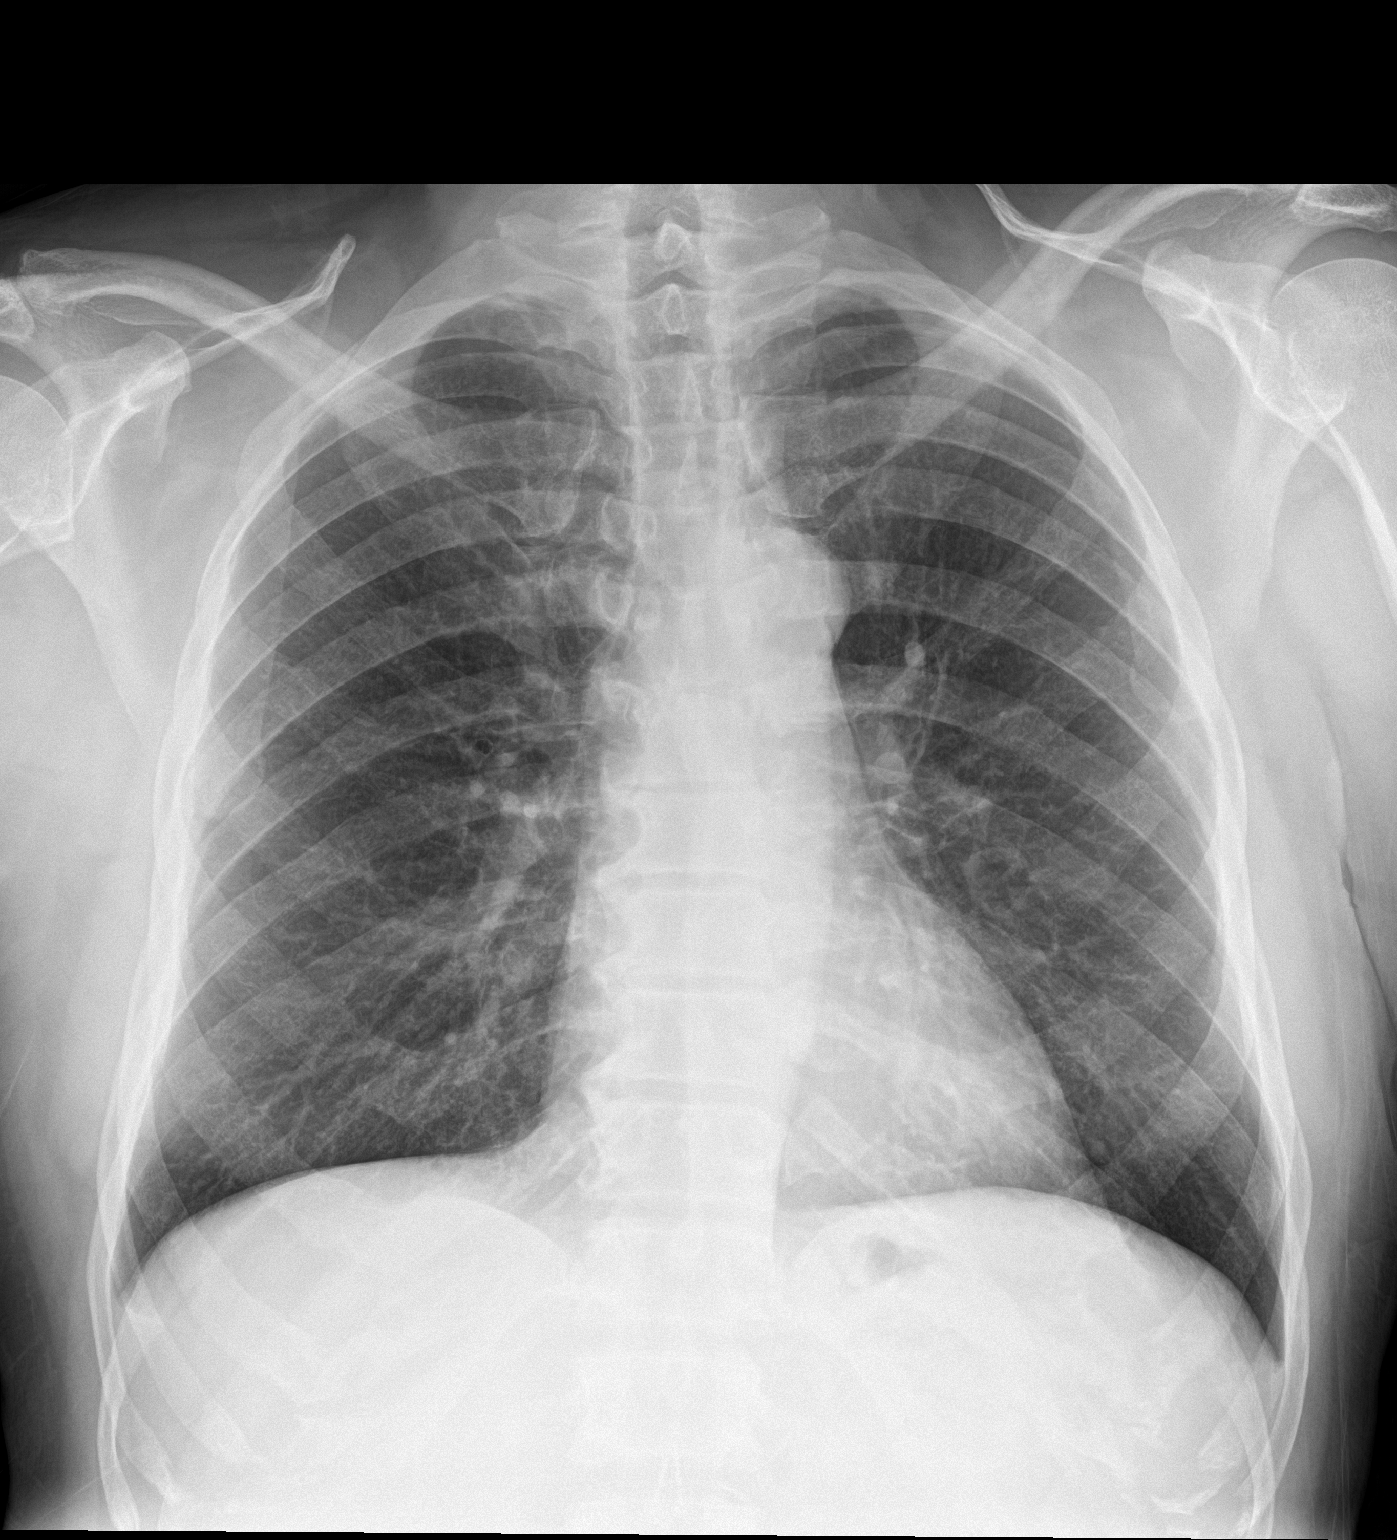

[chest lat]
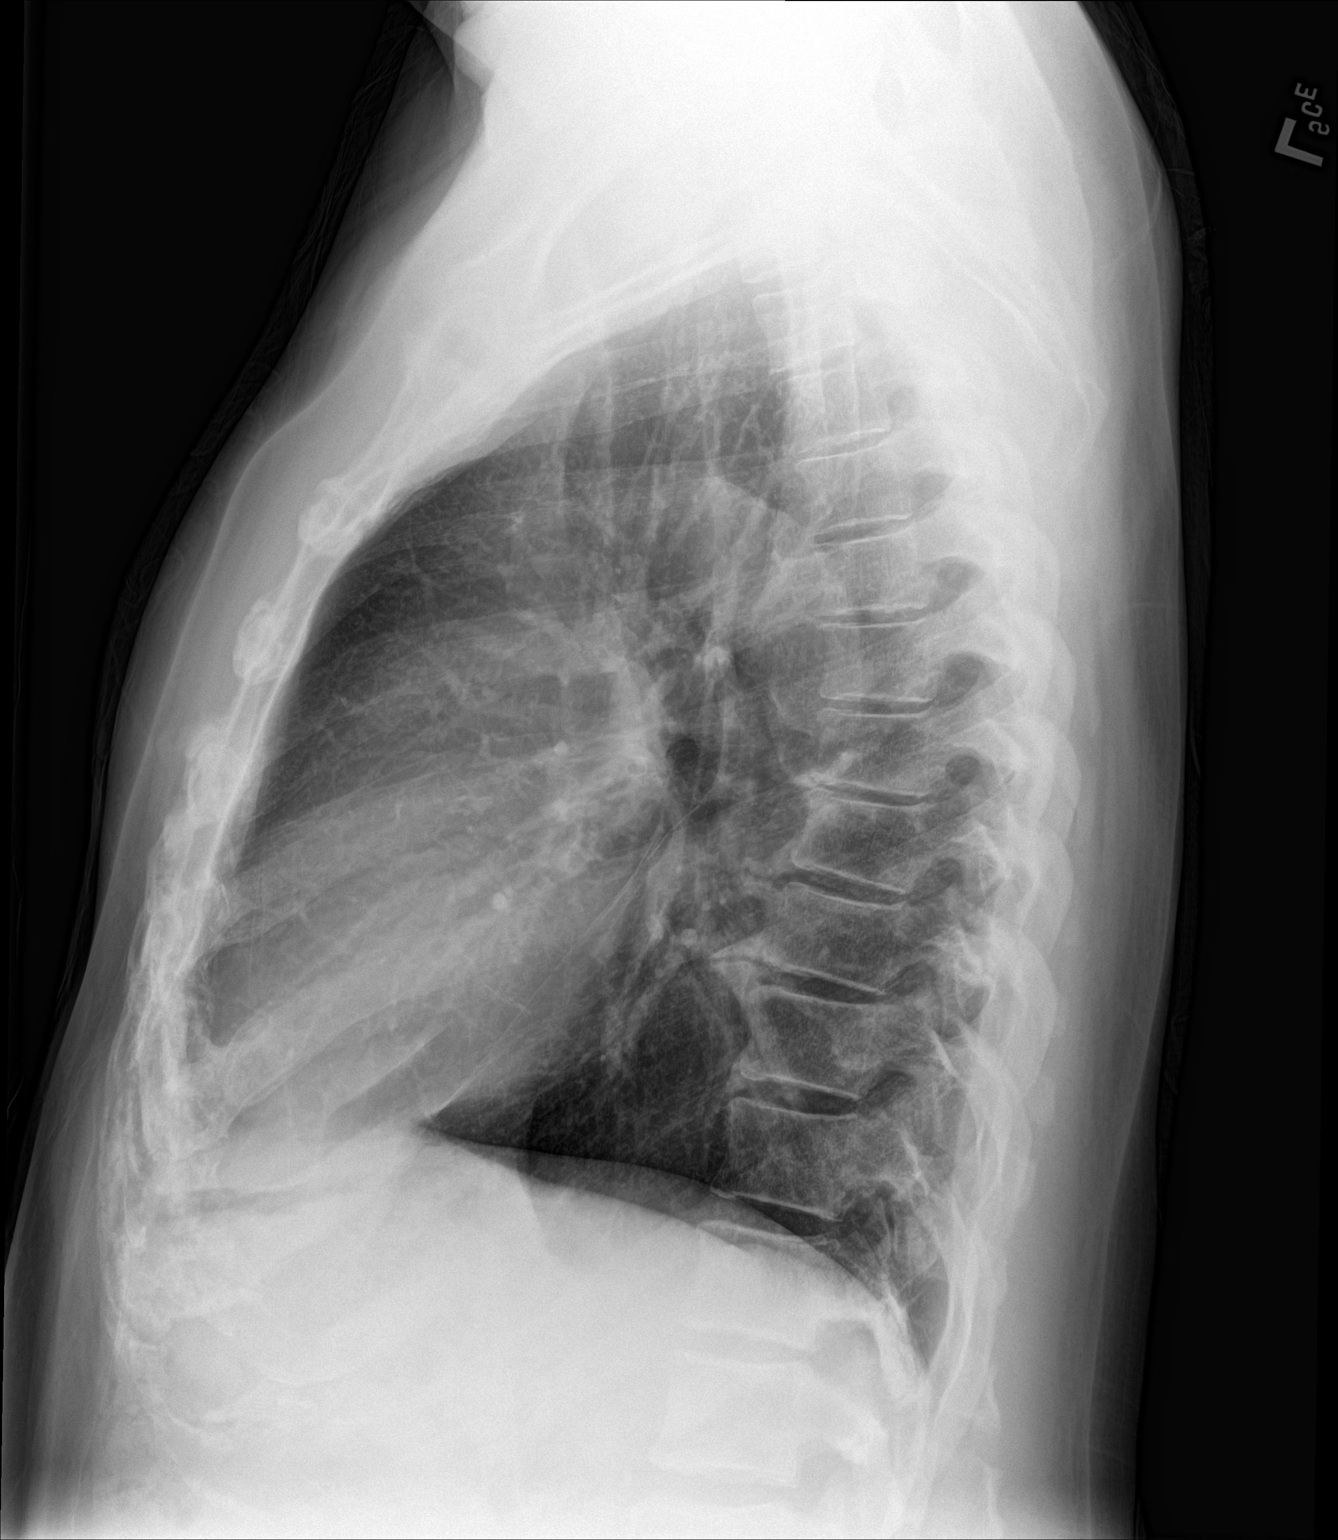

[2 of 2 positions shown; findings below may reference images not displayed]

FINDINGS: The heart size and mediastinal contours are within normal limits.
Both lungs are clear. The visualized skeletal structures are
unremarkable.
IMPRESSION: No active cardiopulmonary disease.

## 2017-02-03 ENCOUNTER — Other Ambulatory Visit: Payer: Self-pay | Admitting: Family Medicine

## 2017-02-03 DIAGNOSIS — E119 Type 2 diabetes mellitus without complications: Secondary | ICD-10-CM

## 2017-02-15 ENCOUNTER — Ambulatory Visit (INDEPENDENT_AMBULATORY_CARE_PROVIDER_SITE_OTHER): Payer: BLUE CROSS/BLUE SHIELD | Admitting: Family Medicine

## 2017-02-15 ENCOUNTER — Encounter: Payer: Self-pay | Admitting: Family Medicine

## 2017-02-15 VITALS — BP 112/62 | HR 83 | Temp 98.3°F | Resp 14 | Ht 68.0 in | Wt 154.9 lb

## 2017-02-15 DIAGNOSIS — E119 Type 2 diabetes mellitus without complications: Secondary | ICD-10-CM

## 2017-02-15 LAB — POCT GLYCOSYLATED HEMOGLOBIN (HGB A1C): HEMOGLOBIN A1C: 6.2

## 2017-02-15 MED ORDER — METFORMIN HCL 500 MG PO TABS: 500.0000 mg | ORAL_TABLET | Freq: Two times a day (BID) | ORAL | 0 refills | Status: DC

## 2017-02-15 NOTE — Progress Notes (Signed)
Name: Rodney Moody   MRN: [id number]    DOB: [birth year]   Date:02/15/2017       Progress Note  Subjective  Chief Complaint  Chief Complaint  Patient presents with  . Follow-up    2 month   . Medication Refill    protonix and metformin     Diabetes  He presents for his follow-up diabetic visit. He has type 2 diabetes mellitus. His disease course has been stable. Pertinent negatives for diabetes include no fatigue, no foot paresthesias, no polydipsia and no polyuria. Current diabetic treatment includes oral agent (monotherapy). His weight is stable. He is following a generally healthy diet. He monitors blood glucose at home 1-2 x per day. His breakfast blood glucose range is generally >200 mg/dl. An ACE inhibitor/angiotensin II receptor blocker is not being taken.     Past Medical History:  Diagnosis Date  . Diabetes mellitus without complication ([place])    borderline Diabetes, but no medication  . GERD (gastroesophageal reflux disease)   . Hepatitis B   . Hepatitis C   . Prostate cancer [facility])     Past Surgical History:  Procedure Laterality Date  . COLONOSCOPY W/ BIOPSIES  2014   Patient has polyps-benign  . COLONOSCOPY WITH PROPOFOL N/A 06/04/2016   Procedure: COLONOSCOPY WITH PROPOFOL;  Surgeon: Lollie Sails, MD;  Location: [facility] ENDOSCOPY;  Service: Endoscopy;  Laterality: N/A;  . ESOPHAGOGASTRODUODENOSCOPY N/A 06/04/2016   Procedure: ESOPHAGOGASTRODUODENOSCOPY (EGD);  Surgeon: Lollie Sails, MD;  Location: [facility] ENDOSCOPY;  Service: Endoscopy;  Laterality: N/A;  . laparoscopic resection of liver    . left shoulder    . PROSTATE BIOPSY      Family History  Problem Relation Age of Onset  . Diabetes Mother   . Heart disease Brother   . Hypertension Brother     Social History   Social History  . Marital status: Married    Spouse name: N/A  . Number of children: N/A  . Years of education: N/A   Occupational History  . Not on file.   Social History Main  Topics  . Smoking status: Former Smoker    Quit date: 02/25/1998  . Smokeless tobacco: Never Used  . Alcohol use No     Comment: quit in 1999  was not a heavy drinker  . 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 Sexual activity: No   Other Topics Concern  . Not on file   Social History Narrative   Married with 1 child           Current Outpatient Prescriptions:  .  aspirin 81 MG tablet, Take 81 mg by mouth daily., Disp: , Rfl:  .  metFORMIN (GLUCOPHAGE) 500 MG tablet, Take 1 tablet (500 mg total) by mouth 2 (two) times daily with a meal., Disp: 60 tablet, Rfl: 1 .  pantoprazole (PROTONIX) 40 MG tablet, Take 1 tablet (40 mg total) by mouth daily., Disp: 90 tablet, Rfl: 0  No Known Allergies   Review of Systems  Constitutional: Negative for fatigue.  Endo/Heme/Allergies: Negative for polydipsia.      Objective  Vitals:   02/15/17 1125  BP: 112/62  Pulse: 83  Resp: 14  Temp: 98.3 F (36.8 C)  TempSrc: Oral  SpO2: 98%  Weight: 154 lb 14.4 oz (70.3 kg)  Height: _0  (1.727 m)    Physical Exam  Constitutional: He is oriented to person, place, and time  and well-developed, well-nourished, and in no distress.  Cardiovascular: Normal rate, regular rhythm and normal heart sounds.   No murmur heard. Pulmonary/Chest: Effort normal and breath sounds normal. He has no wheezes.  Neurological: He is alert and oriented to person, place, and time.  Nursing note and vitals reviewed.     Recent Results (from the past 2160 hour(s))  Basic metabolic panel     Status: Abnormal   Collection Time: 01/03/17  7:10 PM  Result Value Ref Range   Sodium 127 (L) 135 - 145 mmol/L   Potassium 4.5 3.5 - 5.1 mmol/L   Chloride 91 (L) 101 - 111 mmol/L   CO2 27 22 - 32 mmol/L   Glucose, Bld 355 (H) 65 - 99 mg/dL   BUN 33 (H) 6 - 20 mg/dL   Creatinine, Ser 1.14 0.61 - 1.24 mg/dL   Calcium 9.3 8.9 - 10.3 mg/dL   GFR calc non Af Amer >60 >60 mL/min   GFR calc Af  Amer >60 >60 mL/min    Comment: (NOTE) The eGFR has been calculated using the CKD EPI equation. This calculation has not been validated in all clinical situations. eGFR's persistently <60 mL/min signify possible Chronic Kidney Disease.    Anion gap 9 5 - 15  CBC     Status: Abnormal   Collection Time: 01/03/17  7:10 PM  Result Value Ref Range   WBC 6.7 3.8 - 10.6 K/uL   RBC 4.48 4.40 - 5.90 MIL/uL   Hemoglobin 12.1 (L) 13.0 - 18.0 g/dL   HCT 35.0 (L) 40.0 - 52.0 %   MCV 78.1 (L) 80.0 - 100.0 fL   MCH 27.1 26.0 - 34.0 pg   MCHC 34.7 32.0 - 36.0 g/dL   RDW 15.8 (H) 11.5 - 14.5 %   Platelets 340 150 - 440 K/uL  Urinalysis, Complete w Microscopic     Status: Abnormal   Collection Time: 01/03/17  7:10 PM  Result Value Ref Range   Color, Urine YELLOW (A) YELLOW   APPearance CLEAR (A) CLEAR   Specific Gravity, Urine 1.025 1.005 - 1.030   pH 5.0 5.0 - 8.0   Glucose, UA >=500 (A) NEGATIVE mg/dL   Hgb urine dipstick MODERATE (A) NEGATIVE   Bilirubin Urine NEGATIVE NEGATIVE   Ketones, ur 5 (A) NEGATIVE mg/dL   Protein, ur NEGATIVE NEGATIVE mg/dL   Nitrite NEGATIVE NEGATIVE   Leukocytes, UA NEGATIVE NEGATIVE   RBC / HPF 0-5 0 - 5 RBC/hpf   WBC, UA 0-5 0 - 5 WBC/hpf   Bacteria, UA NONE SEEN NONE SEEN   Squamous Epithelial / LPF NONE SEEN NONE SEEN  Glucose, capillary     Status: Abnormal   Collection Time: 01/03/17  7:11 PM  Result Value Ref Range   Glucose-Capillary 340 (H) 65 - 99 mg/dL  Glucose, capillary     Status: Abnormal   Collection Time: 01/03/17  8:26 PM  Result Value Ref Range   Glucose-Capillary 294 (H) 65 - 99 mg/dL  POCT HgB A1C     Status: Abnormal   Collection Time: 02/15/17 11:30 AM  Result Value Ref Range   Hemoglobin A1C 6.2      Assessment & Plan  1. Controlled type 2 diabetes mellitus without complication, without long-term current use of insulin (HCC) Review A1c 6.2%, well-controlled diabetes, continue on metformin - POCT HgB A1C - metFORMIN  (GLUCOPHAGE) 500 MG tablet; Take 1 tablet (500 mg total) by mouth 2 (two) times daily with a meal.  Dispense: 180 tablet; Refill: 0   Moua Rasmusson [facility] 02/15/2017 11:31 AM

## 2017-02-20 ENCOUNTER — Other Ambulatory Visit: Payer: Self-pay | Admitting: Family Medicine

## 2017-02-20 DIAGNOSIS — K219 Gastro-esophageal reflux disease without esophagitis: Secondary | ICD-10-CM

## 2017-04-03 ENCOUNTER — Ambulatory Visit
Admission: RE | Admit: 2017-04-03 | Discharge: 2017-04-03 | Disposition: A | Discharge status: Home / Self Care | Payer: BLUE CROSS/BLUE SHIELD | Source: Ambulatory Visit | Attending: Radiation Oncology | Admitting: Radiation Oncology

## 2017-04-03 ENCOUNTER — Encounter: Payer: Self-pay | Admitting: Radiation Oncology

## 2017-04-03 ENCOUNTER — Other Ambulatory Visit: Payer: Self-pay | Admitting: *Deleted

## 2017-04-03 ENCOUNTER — Other Ambulatory Visit: Payer: Self-pay

## 2017-04-03 VITALS — BP 155/71 | HR 71 | Temp 96.2°F | Resp 20 | Wt 168.4 lb

## 2017-04-03 DIAGNOSIS — Z7982 Long term (current) use of aspirin: Secondary | ICD-10-CM | POA: Diagnosis not present

## 2017-04-03 DIAGNOSIS — C220 Liver cell carcinoma: Secondary | ICD-10-CM | POA: Insufficient documentation

## 2017-04-03 DIAGNOSIS — K219 Gastro-esophageal reflux disease without esophagitis: Secondary | ICD-10-CM | POA: Diagnosis not present

## 2017-04-03 DIAGNOSIS — C228 Malignant neoplasm of liver, primary, unspecified as to type: Secondary | ICD-10-CM

## 2017-04-03 DIAGNOSIS — C7801 Secondary malignant neoplasm of right lung: Secondary | ICD-10-CM | POA: Insufficient documentation

## 2017-04-03 DIAGNOSIS — Z923 Personal history of irradiation: Secondary | ICD-10-CM | POA: Insufficient documentation

## 2017-04-03 DIAGNOSIS — Z510 Encounter for antineoplastic radiation therapy: Secondary | ICD-10-CM | POA: Diagnosis not present

## 2017-04-03 DIAGNOSIS — Z8619 Personal history of other infectious and parasitic diseases: Secondary | ICD-10-CM | POA: Diagnosis not present

## 2017-04-03 DIAGNOSIS — Z8546 Personal history of malignant neoplasm of prostate: Secondary | ICD-10-CM | POA: Diagnosis not present

## 2017-04-03 DIAGNOSIS — Z87891 Personal history of nicotine dependence: Secondary | ICD-10-CM | POA: Diagnosis not present

## 2017-04-03 DIAGNOSIS — Z7984 Long term (current) use of oral hypoglycemic drugs: Secondary | ICD-10-CM | POA: Diagnosis not present

## 2017-04-03 DIAGNOSIS — E119 Type 2 diabetes mellitus without complications: Secondary | ICD-10-CM | POA: Diagnosis not present

## 2017-04-03 NOTE — Consult Note (Signed)
NEW PATIENT EVALUATION  Name: Rodney Moody  MRN: [id number]  Date:   04/03/2017     DOB: 04-Mar-1946   This 71 y.o. male patient presents to the clinic for initial evaluation of palliative radiation therapy to his right lung for metastatic hepatocellular carcinoma.  REFERRING PHYSICIAN: Roselee Nova, MD  CHIEF COMPLAINT:  Chief Complaint  Patient presents with  . Cancer    Pt is here for initial consultation of metastatic liver cancer.       DIAGNOSIS: The encounter diagnosis was Liver cancer, primary, with metastasis from liver to other site [facility]).   PREVIOUS INVESTIGATIONS:  PET CT scans requested from [facility] for my review Pathology reports reviewed Clinical notes reviewed  HPI: Patient is a 71 year old male with history of hepatitis C diagnosed in 2002 treated with interferon. His liver function tests became elevated in 2017 and he had on MRI in March 2018 a 7 cm infiltrative mass with extension into the distal left portal vein. His alpha-fetoprotein was 1665. He underwent a partial hepatic resection June 2017 with pathology showing poorly differentiated [place]. He went on to develop metastatic disease with lung involvement in February 2018 with bilateral pulmonary nodules. His AFP was climbing. He was started on a clinical trial at [place] and Tremelimumab Administered as Monotherapy . Repeat CT scan of the chest in July 2018 showed decrease in pulmonary nodules but a new mediastinal lymph node. This area has continued to increase as a right paratracheal hypermetabolic lymph node concerning for metastatic disease. He has currently stable appearance of the liver. There is no evidence of other metastatic disease at this time in the abdomen or pelvis. Recommendation was made for hypofractionated course of radiation therapy to this lesion without biopsy at [facility]. He is now referred closer to home for consideration of treatment. He states he is feeling well. He specifically  denies nausea diarrhea any evidence of bone pain. Patient is status post I-125 interstitial implant for prostate cancer back in 2013. He continues to work full-time.    PLANNED TREATMENT REGIMEN: Hypofractionated I MRT radiation therapy  PAST MEDICAL HISTORY:  has a past medical history of Diabetes mellitus without complication ([place]), GERD (gastroesophageal reflux disease), Hepatitis B, Hepatitis C, and Prostate cancer ([place]).    PAST SURGICAL HISTORY:  Past Surgical History:  Procedure Laterality Date  . COLONOSCOPY W/ BIOPSIES  2014   Patient has polyps-benign  . COLONOSCOPY WITH PROPOFOL N/A 06/04/2016   Procedure: COLONOSCOPY WITH PROPOFOL;  Surgeon: Lollie Sails, MD;  Location: [facility] ENDOSCOPY;  Service: Endoscopy;  Laterality: N/A;  . ESOPHAGOGASTRODUODENOSCOPY N/A 06/04/2016   Procedure: ESOPHAGOGASTRODUODENOSCOPY (EGD);  Surgeon: Lollie Sails, MD;  [facility] ENDOSCOPY;  Service: Endoscopy;  Laterality: N/A;  . laparoscopic resection of liver    . left shoulder    . PROSTATE BIOPSY      FAMILY HISTORY: family history includes Diabetes in his mother; Heart disease in his brother; Hypertension in his brother.  SOCIAL HISTORY:  reports that he quit smoking about 19 years ago. he has never used smokeless tobacco. He reports that he does not drink alcohol or use drugs.  ALLERGIES: Patient has no known allergies.  MEDICATIONS:  Current Outpatient Medications  Medication Sig Dispense Refill  . aspirin 81 MG tablet Take 81 mg by mouth daily.    Marland Kitchen glipiZIDE (GLUCOTROL) 5 MG tablet Take by mouth.    Marland Kitchen lisinopril (PRINIVIL,ZESTRIL) 5 MG tablet     . metFORMIN (GLUCOPHAGE) 500  MG tablet Take 1 tablet (500 mg total) by mouth 2 (two) times daily with a meal. 180 tablet 0  . methimazole (TAPAZOLE) 10 MG tablet TAKE 2 TABLETS (20 MG TOTAL) BY MOUTH ONCE DAILY.  5  . pantoprazole (PROTONIX) 40 MG tablet TAKE 1 TABLET BY MOUTH EVERY DAY 90 tablet 0  . loperamide (IMODIUM) 2 MG  capsule Take by mouth.    . magnesium oxide (MAG-OX) 400 MG tablet Take by mouth.     No current facility-administered medications for this encounter.     ECOG PERFORMANCE STATUS:  0 - Asymptomatic  REVIEW OF SYSTEMS:  Patient denies any weight loss, fatigue, weakness, fever, chills or night sweats. Patient denies any loss of vision, blurred vision. Patient denies any ringing  of the ears or hearing loss. No irregular heartbeat. Patient denies heart murmur or history of fainting. Patient denies any chest pain or pain radiating to her upper extremities. Patient denies any shortness of breath, difficulty breathing at night, cough or hemoptysis. Patient denies any swelling in the lower legs. Patient denies any nausea vomiting, vomiting of blood, or coffee ground material in the vomitus. Patient denies any stomach pain. Patient states has had normal bowel movements no significant constipation or diarrhea. Patient denies any dysuria, hematuria or significant nocturia. Patient denies any problems walking, swelling in the joints or loss of balance. Patient denies any skin changes, loss of hair or loss of weight. Patient denies any excessive worrying or anxiety or significant depression. Patient denies any problems with insomnia. Patient denies excessive thirst, polyuria, polydipsia. Patient denies any swollen glands, patient denies easy bruising or easy bleeding. Patient denies any recent infections, allergies or URI. Patient "s visual fields have not changed significantly in recent time.    PHYSICAL EXAM: BP (!) 155/71   Pulse 71   Temp (!) 96.2 F (35.7 C)   Resp 20   Wt 168 lb 6.9 oz (76.4 kg)   BMI 25.61 kg/m  Well-developed well-nourished patient in NAD. HEENT reveals PERLA, EOMI, discs not visualized.  Oral cavity is clear. No oral mucosal lesions are identified. Neck is clear without evidence of cervical or supraclavicular adenopathy. Lungs are clear to A&P. Cardiac examination is essentially  unremarkable with regular rate and rhythm without murmur rub or thrill. Abdomen is benign with no organomegaly or masses noted. Motor sensory and DTR levels are equal and symmetric in the upper and lower extremities. Cranial nerves II through XII are grossly intact. Proprioception is intact. No peripheral adenopathy or edema is identified. No motor or sensory levels are noted. Crude visual fields are within normal range.  LABORATORY DATA: Pathology reports reviewed    RADIOLOGY RESULTS: PET CT and CT scans requested for my review from [place]: Metastatic HCC to lung in the right hilar region in 72 year old male currently under good systemic control of his stage IV disease under clinical trial.   PLAN: [place] has recommended 5000 cGy in 10 fractions to the area of right hilar involvement of metastatic disease. I would like to obtain his PET CT scan and CT scans from [facility] for my evaluation and go ahead with hypofractionated course of radiation therapy using IM RT treatment planning and delivery to this lesion. Risks and benefits of treatment including possible radiation esophagitis cough alteration of blood counts skin reaction fatigue all were discussed in detail with the patient. I have personally 7 ordered CT simulation for the end of next week after I obtained his  films. Patient seems to comprehend my treatment plan well and has excepted treatment.  I would like to take this opportunity to thank you for allowing me to participate in the care of your patient.Armstead Peaks., MD

## 2017-04-04 ENCOUNTER — Ambulatory Visit
Admission: RE | Admit: 2017-04-04 | Discharge: 2017-04-04 | Disposition: A | Discharge status: Home / Self Care | Payer: Self-pay | Source: Ambulatory Visit | Attending: Radiation Oncology | Admitting: Radiation Oncology

## 2017-04-04 ENCOUNTER — Other Ambulatory Visit: Payer: Self-pay | Admitting: Radiation Oncology

## 2017-04-04 DIAGNOSIS — C228 Malignant neoplasm of liver, primary, unspecified as to type: Secondary | ICD-10-CM

## 2017-04-11 ENCOUNTER — Ambulatory Visit
Admission: RE | Admit: 2017-04-11 | Discharge: 2017-04-11 | Disposition: A | Discharge status: Home / Self Care | Payer: BLUE CROSS/BLUE SHIELD | Source: Ambulatory Visit | Attending: Radiation Oncology | Admitting: Radiation Oncology

## 2017-04-11 DIAGNOSIS — C228 Malignant neoplasm of liver, primary, unspecified as to type: Secondary | ICD-10-CM | POA: Diagnosis not present

## 2017-04-11 DIAGNOSIS — N200 Calculus of kidney: Secondary | ICD-10-CM | POA: Insufficient documentation

## 2017-04-11 DIAGNOSIS — N281 Cyst of kidney, acquired: Secondary | ICD-10-CM | POA: Insufficient documentation

## 2017-04-11 DIAGNOSIS — R590 Localized enlarged lymph nodes: Secondary | ICD-10-CM | POA: Diagnosis not present

## 2017-04-11 DIAGNOSIS — C7801 Secondary malignant neoplasm of right lung: Secondary | ICD-10-CM | POA: Diagnosis not present

## 2017-04-11 LAB — GLUCOSE, CAPILLARY
Glucose-Capillary: 52 mg/dL — ABNORMAL LOW (ref 65–99)
Glucose-Capillary: 53 mg/dL — ABNORMAL LOW (ref 65–99)

## 2017-04-11 MED ORDER — FLUDEOXYGLUCOSE F - 18 (FDG) INJECTION
12.6000 | Freq: Once | INTRAVENOUS | Status: AC | PRN
  Administered 2017-04-11: 12.6 via INTRAVENOUS

## 2017-04-22 ENCOUNTER — Other Ambulatory Visit: Payer: Self-pay | Admitting: *Deleted

## 2017-04-22 DIAGNOSIS — C7801 Secondary malignant neoplasm of right lung: Secondary | ICD-10-CM | POA: Diagnosis not present

## 2017-04-25 ENCOUNTER — Ambulatory Visit
Admission: RE | Admit: 2017-04-25 | Discharge: 2017-04-25 | Disposition: A | Discharge status: Home / Self Care | Payer: BLUE CROSS/BLUE SHIELD | Source: Ambulatory Visit | Attending: Radiation Oncology | Admitting: Radiation Oncology

## 2017-04-25 DIAGNOSIS — C7801 Secondary malignant neoplasm of right lung: Secondary | ICD-10-CM | POA: Diagnosis not present

## 2017-04-26 ENCOUNTER — Ambulatory Visit
Admission: RE | Admit: 2017-04-26 | Discharge: 2017-04-26 | Disposition: A | Discharge status: Home / Self Care | Payer: BLUE CROSS/BLUE SHIELD | Source: Ambulatory Visit | Attending: Radiation Oncology | Admitting: Radiation Oncology

## 2017-04-26 DIAGNOSIS — C7801 Secondary malignant neoplasm of right lung: Secondary | ICD-10-CM | POA: Diagnosis not present

## 2017-04-29 ENCOUNTER — Ambulatory Visit: Payer: BLUE CROSS/BLUE SHIELD

## 2017-04-29 ENCOUNTER — Ambulatory Visit
Admission: RE | Admit: 2017-04-29 | Discharge: 2017-04-29 | Disposition: A | Discharge status: Home / Self Care | Payer: BLUE CROSS/BLUE SHIELD | Source: Ambulatory Visit | Attending: Radiation Oncology | Admitting: Radiation Oncology

## 2017-04-29 DIAGNOSIS — C7801 Secondary malignant neoplasm of right lung: Secondary | ICD-10-CM | POA: Diagnosis not present

## 2017-05-01 ENCOUNTER — Ambulatory Visit: Payer: BLUE CROSS/BLUE SHIELD

## 2017-05-01 ENCOUNTER — Ambulatory Visit
Admission: RE | Admit: 2017-05-01 | Discharge: 2017-05-01 | Disposition: A | Discharge status: Home / Self Care | Payer: BLUE CROSS/BLUE SHIELD | Source: Ambulatory Visit | Attending: Radiation Oncology | Admitting: Radiation Oncology

## 2017-05-01 DIAGNOSIS — C7801 Secondary malignant neoplasm of right lung: Secondary | ICD-10-CM | POA: Diagnosis not present

## 2017-05-02 ENCOUNTER — Ambulatory Visit: Payer: BLUE CROSS/BLUE SHIELD

## 2017-05-02 ENCOUNTER — Ambulatory Visit
Admission: RE | Admit: 2017-05-02 | Discharge: 2017-05-02 | Disposition: A | Discharge status: Home / Self Care | Payer: BLUE CROSS/BLUE SHIELD | Source: Ambulatory Visit | Attending: Radiation Oncology | Admitting: Radiation Oncology

## 2017-05-02 ENCOUNTER — Inpatient Hospital Stay: Payer: BLUE CROSS/BLUE SHIELD | Attending: Radiation Oncology

## 2017-05-02 DIAGNOSIS — C7801 Secondary malignant neoplasm of right lung: Secondary | ICD-10-CM | POA: Diagnosis not present

## 2017-05-03 ENCOUNTER — Ambulatory Visit
Admission: RE | Admit: 2017-05-03 | Discharge: 2017-05-03 | Disposition: A | Discharge status: Home / Self Care | Payer: BLUE CROSS/BLUE SHIELD | Source: Ambulatory Visit | Attending: Radiation Oncology | Admitting: Radiation Oncology

## 2017-05-03 ENCOUNTER — Ambulatory Visit: Payer: BLUE CROSS/BLUE SHIELD

## 2017-05-03 DIAGNOSIS — C7801 Secondary malignant neoplasm of right lung: Secondary | ICD-10-CM | POA: Diagnosis not present

## 2017-05-06 ENCOUNTER — Ambulatory Visit: Payer: BLUE CROSS/BLUE SHIELD

## 2017-05-06 ENCOUNTER — Ambulatory Visit
Admission: RE | Admit: 2017-05-06 | Discharge: 2017-05-06 | Disposition: A | Discharge status: Home / Self Care | Payer: BLUE CROSS/BLUE SHIELD | Source: Ambulatory Visit | Attending: Radiation Oncology | Admitting: Radiation Oncology

## 2017-05-06 DIAGNOSIS — C7801 Secondary malignant neoplasm of right lung: Secondary | ICD-10-CM | POA: Diagnosis not present

## 2017-05-07 ENCOUNTER — Ambulatory Visit
Admission: RE | Admit: 2017-05-07 | Discharge: 2017-05-07 | Disposition: A | Discharge status: Home / Self Care | Payer: BLUE CROSS/BLUE SHIELD | Source: Ambulatory Visit | Attending: Radiation Oncology | Admitting: Radiation Oncology

## 2017-05-07 ENCOUNTER — Ambulatory Visit: Payer: BLUE CROSS/BLUE SHIELD

## 2017-05-07 DIAGNOSIS — C7801 Secondary malignant neoplasm of right lung: Secondary | ICD-10-CM | POA: Diagnosis not present

## 2017-05-08 ENCOUNTER — Ambulatory Visit
Admission: RE | Admit: 2017-05-08 | Discharge: 2017-05-08 | Disposition: A | Discharge status: Home / Self Care | Payer: BLUE CROSS/BLUE SHIELD | Source: Ambulatory Visit | Attending: Radiation Oncology | Admitting: Radiation Oncology

## 2017-05-08 ENCOUNTER — Ambulatory Visit: Payer: BLUE CROSS/BLUE SHIELD

## 2017-05-08 DIAGNOSIS — C7801 Secondary malignant neoplasm of right lung: Secondary | ICD-10-CM | POA: Diagnosis not present

## 2017-05-09 ENCOUNTER — Ambulatory Visit: Payer: BLUE CROSS/BLUE SHIELD

## 2017-05-09 ENCOUNTER — Ambulatory Visit
Admission: RE | Admit: 2017-05-09 | Discharge: 2017-05-09 | Disposition: A | Discharge status: Home / Self Care | Payer: BLUE CROSS/BLUE SHIELD | Source: Ambulatory Visit | Attending: Radiation Oncology | Admitting: Radiation Oncology

## 2017-05-09 DIAGNOSIS — C7801 Secondary malignant neoplasm of right lung: Secondary | ICD-10-CM | POA: Diagnosis not present

## 2017-05-10 ENCOUNTER — Ambulatory Visit: Payer: BLUE CROSS/BLUE SHIELD

## 2017-05-13 ENCOUNTER — Ambulatory Visit: Payer: BLUE CROSS/BLUE SHIELD

## 2017-05-14 ENCOUNTER — Ambulatory Visit: Payer: BLUE CROSS/BLUE SHIELD

## 2017-05-15 ENCOUNTER — Ambulatory Visit: Payer: BLUE CROSS/BLUE SHIELD

## 2017-05-16 ENCOUNTER — Ambulatory Visit: Payer: BLUE CROSS/BLUE SHIELD

## 2017-05-17 ENCOUNTER — Ambulatory Visit: Payer: BLUE CROSS/BLUE SHIELD

## 2017-05-20 ENCOUNTER — Ambulatory Visit: Payer: BLUE CROSS/BLUE SHIELD

## 2017-05-20 ENCOUNTER — Ambulatory Visit: Payer: BLUE CROSS/BLUE SHIELD | Admitting: Family Medicine

## 2017-05-21 ENCOUNTER — Ambulatory Visit: Payer: BLUE CROSS/BLUE SHIELD

## 2017-05-22 ENCOUNTER — Ambulatory Visit: Payer: BLUE CROSS/BLUE SHIELD

## 2017-05-23 ENCOUNTER — Ambulatory Visit: Payer: BLUE CROSS/BLUE SHIELD

## 2017-05-24 ENCOUNTER — Ambulatory Visit: Payer: BLUE CROSS/BLUE SHIELD

## 2017-05-24 ENCOUNTER — Ambulatory Visit: Payer: BLUE CROSS/BLUE SHIELD | Admitting: Family Medicine

## 2017-05-24 ENCOUNTER — Encounter: Payer: Self-pay | Admitting: Family Medicine

## 2017-05-24 VITALS — BP 106/54 | HR 76 | Temp 98.5°F | Resp 16 | Ht 68.0 in | Wt 164.5 lb

## 2017-05-24 DIAGNOSIS — E119 Type 2 diabetes mellitus without complications: Secondary | ICD-10-CM

## 2017-05-24 DIAGNOSIS — Z23 Encounter for immunization: Secondary | ICD-10-CM

## 2017-05-24 DIAGNOSIS — E785 Hyperlipidemia, unspecified: Secondary | ICD-10-CM

## 2017-05-24 DIAGNOSIS — K219 Gastro-esophageal reflux disease without esophagitis: Secondary | ICD-10-CM

## 2017-05-24 LAB — POCT GLYCOSYLATED HEMOGLOBIN (HGB A1C): Hemoglobin A1C: 6.5

## 2017-05-24 MED ORDER — METFORMIN HCL 500 MG PO TABS: 500.0000 mg | ORAL_TABLET | Freq: Two times a day (BID) | ORAL | 0 refills | Status: DC

## 2017-05-24 MED ORDER — PANTOPRAZOLE SODIUM 40 MG PO TBEC: 40.0000 mg | DELAYED_RELEASE_TABLET | Freq: Every day | ORAL | 0 refills | Status: DC

## 2017-05-24 NOTE — Progress Notes (Signed)
Name: Rodney Moody   MRN: [id number]    DOB: 06-Jun-1945   Date:05/24/2017       Progress Note  Subjective  Chief Complaint  Chief Complaint  Patient presents with  . Diabetes  . Hyperlipidemia    Pt states he really dont have appeite but he trys to eat healthy   . Hypertension    pt denies any issues    Diabetes  He presents for his follow-up diabetic visit. He has type 2 diabetes mellitus. His disease course has been stable. There are no hypoglycemic associated symptoms. Pertinent negatives for hypoglycemia include no dizziness, speech difficulty or sweats. Pertinent negatives for diabetes include no fatigue, no foot paresthesias, no polydipsia and no polyuria. There are no hypoglycemic complications. Pertinent negatives for diabetic complications include no CVA, heart disease or peripheral neuropathy. Current diabetic treatment includes oral agent (dual therapy). His weight is stable. He is following a generally healthy diet. He rarely participates in exercise. He monitors blood glucose at home 1-2 x per day. His breakfast blood glucose range is generally 130-140 mg/dl. An ACE inhibitor/angiotensin II receptor blocker is being taken.  Gastroesophageal Reflux  He reports no abdominal pain, no belching, no coughing, no heartburn, no nausea, no sore throat or no wheezing. This is a chronic problem. The problem has been unchanged. Pertinent negatives include no fatigue. He has tried a PPI for the symptoms.     Past Medical History:  Diagnosis Date  . Diabetes mellitus without complication ([place])    borderline Diabetes, but no medication  . GERD (gastroesophageal reflux disease)   . Hepatitis B   . Hepatitis C   . Prostate cancer [facility])     Past Surgical History:  Procedure Laterality Date  . COLONOSCOPY W/ BIOPSIES  2014   Patient has polyps-benign  . COLONOSCOPY WITH PROPOFOL N/A 06/04/2016   Procedure: COLONOSCOPY WITH PROPOFOL;  Surgeon: Lollie Sails, MD;  Location: [facility]  ENDOSCOPY;  Service: Endoscopy;  Laterality: N/A;  . ESOPHAGOGASTRODUODENOSCOPY N/A 06/04/2016   Procedure: ESOPHAGOGASTRODUODENOSCOPY (EGD);  Surgeon: Lollie Sails, MD;  Location: [facility] ENDOSCOPY;  Service: Endoscopy;  Laterality: N/A;  . laparoscopic resection of liver    . left shoulder    . PROSTATE BIOPSY      Family History  Problem Relation Age of Onset  . Diabetes Mother   . Hypertension Mother   . Heart disease Brother   . Hypertension Brother   . Hypertension Brother   . Heart attack Brother     Social History   Socioeconomic History  . Marital status: Married    Spouse name: Not on file  . Number of children: Not on file  . Years of education: Not on file  . Highest education level: Not on file  Social Needs  . Financial resource strain: Not on file  . Food insecurity - worry: Not on file  . Food insecurity - inability: Not on file  . Transportation needs - medical: Not on file  . Transportation needs - non-medical: Not on file  Occupational History  . Not on file  Tobacco Use  . Smoking status: Former Smoker    Last attempt to quit: 02/25/1998    Years since quitting: 19.2  . Smokeless tobacco: Never Used  Substance and Sexual Activity  . Alcohol use: No    Alcohol/week: 0.0 oz    Comment: quit in 1999  was not a heavy drinker  . Drug use: No    Comment: remote  injection drug use in 20"s  . Sexual activity: No    Partners: Female  Other Topics Concern  . Not on file  Social History Narrative   Married with 1 child           Current Outpatient Medications:  .  aspirin 81 MG tablet, Take 81 mg by mouth daily., Disp: , Rfl:  .  glipiZIDE (GLUCOTROL) 5 MG tablet, Take by mouth., Disp: , Rfl:  .  metFORMIN (GLUCOPHAGE) 500 MG tablet, Take 1 tablet (500 mg total) by mouth 2 (two) times daily with a meal., Disp: 180 tablet, Rfl: 0 .  methimazole (TAPAZOLE) 10 MG tablet, TAKE 2 TABLETS (20 MG TOTAL) BY MOUTH ONCE DAILY., Disp: , Rfl: 5 .   pantoprazole (PROTONIX) 40 MG tablet, TAKE 1 TABLET BY MOUTH EVERY DAY, Disp: 90 tablet, Rfl: 0 .  lisinopril (PRINIVIL,ZESTRIL) 5 MG tablet, , Disp: , Rfl:  .  loperamide (IMODIUM) 2 MG capsule, Take by mouth., Disp: , Rfl:  .  magnesium oxide (MAG-OX) 400 MG tablet, Take by mouth., Disp: , Rfl:   No Known Allergies   Review of Systems  Constitutional: Negative for fatigue.  HENT: Negative for sore throat.   Respiratory: Negative for cough and wheezing.   Gastrointestinal: Negative for abdominal pain, heartburn and nausea.  Neurological: Negative for dizziness and speech difficulty.  Endo/Heme/Allergies: Negative for polydipsia.   Objective  Vitals:   05/24/17 1200 05/24/17 1204  BP: (!) 102/56 (!) 106/54  Pulse: 76   Resp: 16   Temp: 98.5 F (36.9 C)   TempSrc: Oral   SpO2: 99%   Weight: 164 lb 8 oz (74.6 kg)   Height: 5\' 8"  (9.381 m)     Physical Exam  Constitutional: He is oriented to person, place, and time and well-developed, well-nourished, and in no distress.  HENT:  Head: Normocephalic and atraumatic.  Cardiovascular: Normal rate, regular rhythm and normal heart sounds.  No murmur heard. Pulmonary/Chest: Effort normal and breath sounds normal. He has no wheezes.  Abdominal: Soft. Bowel sounds are normal.  Musculoskeletal: He exhibits edema.  Neurological: He is alert and oriented to person, place, and time.  Psychiatric: Mood, memory, affect and judgment normal.  Nursing note and vitals reviewed.    Recent Results (from the past 2160 hour(s))  Glucose, capillary     Status: Abnormal   Collection Time: 04/11/17 12:17 PM  Result Value Ref Range   Glucose-Capillary 53 (L) 65 - 99 mg/dL  Glucose, capillary     Status: Abnormal   Collection Time: 04/11/17 12:20 PM  Result Value Ref Range   Glucose-Capillary 52 (L) 65 - 99 mg/dL  POCT HgB A1C     Status: Abnormal   Collection Time: 05/24/17 12:06 PM  Result Value Ref Range   Hemoglobin A1C 6.5       Assessment & Plan  1. Controlled type 2 diabetes mellitus without complication, without long-term current use of insulin (HCC)   A1c 6.5%, well-controlled diabetes - POCT HgB A1C - Pneumococcal polysaccharide vaccine 23-valent greater than or equal to 2yo subcutaneous/IM - metFORMIN (GLUCOPHAGE) 500 MG tablet; Take 1 tablet (500 mg total) by mouth 2 (two) times daily with a meal.  Dispense: 180 tablet; Refill: 0  2. Need for 23-polyvalent pneumococcal polysaccharide vaccine  - Pneumococcal polysaccharide vaccine 23-valent greater than or equal to 2yo subcutaneous/IM  3. Gastroesophageal reflux disease, esophagitis presence not specified Symptoms of reflux are stable on PPI - pantoprazole (PROTONIX) 40 MG tablet;  Take 1 tablet (40 mg total) by mouth daily.  Dispense: 90 tablet; Refill: 0  4. Dyslipidemia  - Lipid panel   Syed Asad A. [facility] 05/24/2017 12:22 PM

## 2017-05-27 ENCOUNTER — Ambulatory Visit: Payer: BLUE CROSS/BLUE SHIELD

## 2017-05-28 ENCOUNTER — Ambulatory Visit: Payer: BLUE CROSS/BLUE SHIELD

## 2017-05-29 ENCOUNTER — Ambulatory Visit: Payer: BLUE CROSS/BLUE SHIELD

## 2017-05-30 ENCOUNTER — Ambulatory Visit: Payer: BLUE CROSS/BLUE SHIELD

## 2017-05-31 ENCOUNTER — Ambulatory Visit: Payer: BLUE CROSS/BLUE SHIELD

## 2017-06-13 ENCOUNTER — Encounter: Payer: Self-pay | Admitting: Radiation Oncology

## 2017-06-13 ENCOUNTER — Other Ambulatory Visit: Payer: Self-pay

## 2017-06-13 ENCOUNTER — Ambulatory Visit
Admission: RE | Admit: 2017-06-13 | Discharge: 2017-06-13 | Disposition: A | Discharge status: Home / Self Care | Payer: BLUE CROSS/BLUE SHIELD | Source: Ambulatory Visit | Attending: Radiation Oncology | Admitting: Radiation Oncology

## 2017-06-13 VITALS — BP 126/62 | HR 79 | Temp 97.7°F | Resp 20 | Wt 165.8 lb

## 2017-06-13 DIAGNOSIS — C228 Malignant neoplasm of liver, primary, unspecified as to type: Secondary | ICD-10-CM | POA: Diagnosis not present

## 2017-06-13 DIAGNOSIS — Z923 Personal history of irradiation: Secondary | ICD-10-CM | POA: Insufficient documentation

## 2017-06-13 DIAGNOSIS — C7801 Secondary malignant neoplasm of right lung: Secondary | ICD-10-CM | POA: Diagnosis not present

## 2017-06-13 DIAGNOSIS — R590 Localized enlarged lymph nodes: Secondary | ICD-10-CM | POA: Insufficient documentation

## 2017-06-13 NOTE — Progress Notes (Signed)
Radiation Oncology Follow up Note  Name: Rodney Moody   Date:   06/13/2017 MRN:  [id number] DOB: 10/02/1945    This 72 y.o. male presents to the clinic today for one-month follow-up status post palliative radiation therapy to the right lung hilum for metastatic hepatocellular carcinoma.  REFERRING PROVIDER: Roselee Nova, MD  HPI: Patient is a 72 year old male now out 1 month having completed palliative radiation therapy and the hypofractionated regiment to his right hilum for metastatic hepatocellular carcinoma.Marland Kitchen He is seen today in routine follow-up one month out doing well specifically denies cough hemoptysis chest tightness or dysphagia. Recently had in the middle of January a CT scan of his chest showing questionable progression of mediastinal adenopathy. I have requested those films for my review. He is currentlyon treatment on [id number] ([id number] and Tremelimumab . On review by medical oncology changes in the right hilum were suggestive of radiation rather than progression of disease.      COMPLICATIONS OF TREATMENT: none  FOLLOW UP COMPLIANCE: keeps appointments   PHYSICAL EXAM:  BP 126/62   Pulse 79   Temp 97.7 F (36.5 C)   Resp 20   Wt 165 lb 12.6 oz (75.2 kg)   BMI 25.21 kg/m  Well-developed well-nourished patient in NAD. HEENT reveals PERLA, EOMI, discs not visualized.  Oral cavity is clear. No oral mucosal lesions are identified. Neck is clear without evidence of cervical or supraclavicular adenopathy. Lungs are clear to A&P. Cardiac examination is essentially unremarkable with regular rate and rhythm without murmur rub or thrill. Abdomen is benign with no organomegaly or masses noted. Motor sensory and DTR levels are equal and symmetric in the upper and lower extremities. Cranial nerves II through XII are grossly intact. Proprioception is intact. No peripheral adenopathy or edema is identified. No motor or sensory levels are noted. Crude visual fields are within  normal range.  RADIOLOGY RESULTS: January CT from [place] has been requested for my review  PLAN: At the present time patient is stable. He continues close follow-up care and protocol treatment at [facility] for his stage IV hepatocellular carcinoma. I have asked to see him back in 3-4 months for follow-up. Patient is to call sooner with any concerns.  I would like to take this opportunity to thank you for allowing me to participate in the care of your patient.Noreene Filbert, MD

## 2017-08-19 ENCOUNTER — Other Ambulatory Visit: Payer: Self-pay

## 2017-08-19 DIAGNOSIS — K219 Gastro-esophageal reflux disease without esophagitis: Secondary | ICD-10-CM

## 2017-08-19 MED ORDER — PANTOPRAZOLE SODIUM 40 MG PO TBEC: 40.0000 mg | DELAYED_RELEASE_TABLET | Freq: Every day | ORAL | 0 refills | Status: DC

## 2017-08-19 NOTE — Telephone Encounter (Signed)
Refill request for general medication. Pantoprazole to [facility].  Last office visit: 05/24/2017   Follow up on 08/30/2017

## 2017-08-30 ENCOUNTER — Encounter: Payer: Self-pay | Admitting: Family Medicine

## 2017-08-30 ENCOUNTER — Ambulatory Visit: Payer: BLUE CROSS/BLUE SHIELD | Admitting: Family Medicine

## 2017-08-30 VITALS — BP 94/42 | HR 69 | Temp 98.1°F | Resp 16 | Ht 68.0 in | Wt 157.7 lb

## 2017-08-30 DIAGNOSIS — E0590 Thyrotoxicosis, unspecified without thyrotoxic crisis or storm: Secondary | ICD-10-CM | POA: Diagnosis not present

## 2017-08-30 DIAGNOSIS — B182 Chronic viral hepatitis C: Secondary | ICD-10-CM

## 2017-08-30 DIAGNOSIS — K219 Gastro-esophageal reflux disease without esophagitis: Secondary | ICD-10-CM | POA: Diagnosis not present

## 2017-08-30 DIAGNOSIS — C228 Malignant neoplasm of liver, primary, unspecified as to type: Secondary | ICD-10-CM

## 2017-08-30 DIAGNOSIS — E1169 Type 2 diabetes mellitus with other specified complication: Secondary | ICD-10-CM | POA: Diagnosis not present

## 2017-08-30 DIAGNOSIS — E785 Hyperlipidemia, unspecified: Secondary | ICD-10-CM | POA: Diagnosis not present

## 2017-08-30 DIAGNOSIS — E441 Mild protein-calorie malnutrition: Secondary | ICD-10-CM

## 2017-08-30 DIAGNOSIS — E559 Vitamin D deficiency, unspecified: Secondary | ICD-10-CM | POA: Insufficient documentation

## 2017-08-30 DIAGNOSIS — I952 Hypotension due to drugs: Secondary | ICD-10-CM

## 2017-08-30 DIAGNOSIS — Z8546 Personal history of malignant neoplasm of prostate: Secondary | ICD-10-CM | POA: Insufficient documentation

## 2017-08-30 LAB — POCT GLYCOSYLATED HEMOGLOBIN (HGB A1C): HEMOGLOBIN A1C: 5.9

## 2017-08-30 MED ORDER — PANTOPRAZOLE SODIUM 40 MG PO TBEC: 40.0000 mg | DELAYED_RELEASE_TABLET | Freq: Every day | ORAL | 1 refills | Status: DC

## 2017-08-30 MED ORDER — METFORMIN HCL 500 MG PO TABS: 500.0000 mg | ORAL_TABLET | Freq: Two times a day (BID) | ORAL | 1 refills | Status: DC

## 2017-08-30 NOTE — Progress Notes (Signed)
Name: Rodney Moody   MRN: [id number]    DOB: 07-14-1945   Date:08/30/2017       Progress Note  Subjective  Chief Complaint  Chief Complaint  Patient presents with  . Medication Refill    3 month F/U   . Hypertension    Dizziness-comes and goes   . Gastroesophageal Reflux    Well controlled  . Diabetes    Checks BS-3x weekly-Lowest-90 Average- 102 Highest-198     HPI  Chronic hepatitis C, with liver cancer and mets: seeing Oncologist at [facility], Dr. Leamon Arnt also radiation oncologist Dr. Donella Stade, he goes to [facility] once a month for treatment. He states since started on treatment he has episodes of diarrhea, followed by periods of constipation ( taking imodium prn). He states it is not caused by metformin. He denies pain, jaundice or tremors, no confusion, urine is normal color, denies acholic stools.   DMII: hgbA1C was 7.0% July 2018, but has lost weight, 04/2017 hgbA1C 6.5% and today is down to 5.9%. He has a poor appetite but is trying to eat - states getting a little better. Glucose average 102. We will stop Glipizide and also lisinopril since bp is low and no records of microalbuminuria. Stay hydrated. He has episodes of lightheaded when he gets up, no chest pain or palpitation. He denies polyphagia, polydipsia or polyuria.   GERD: taking medication and denies side effects  Hyperthyroidism: off medication, no palpitation, but has some diarrhea, has follow up with endo on 09/03/2017   Dyslipidemia: not currently on medication, has liver cancer  Malnutrition: multifactorial, started to lose weight when on treatment for cancer last year. Baseline weight around 184 lbs, and is down to 157.7%, he states appetite finally improving, trying to eat as much as he can, but has diarrhea as side effect of therapy, discussed stopping metformin, but he states no problems with  Metformin before therapy.    Patient Active Problem List   Diagnosis Date Noted  . Vitamin D deficiency 08/30/2017  . H/O  malignant neoplasm of prostate 08/30/2017  . Hyperthyroidism without crisis 12/28/2016  . Chronic viral hepatitis C ([place]) 02/07/2016  . Low back pain 12/16/2015  . Hepatocellular carcinoma ([place]) 08/16/2015  . Elevated liver enzymes 03/18/2015  . GERD (gastroesophageal reflux disease) 11/19/2014  . Dyslipidemia associated with type 2 diabetes mellitus ([place]) 11/19/2014  . Prostate cancer ([place]) 11/19/2014  . Dyslipidemia 11/19/2014  . Hyperlipidemia, unspecified 11/19/2014    Past Surgical History:  Procedure Laterality Date  . COLONOSCOPY W/ BIOPSIES  2014   Patient has polyps-benign  . COLONOSCOPY WITH PROPOFOL N/A 06/04/2016   Procedure: COLONOSCOPY WITH PROPOFOL;  Surgeon: Lollie Sails, MD;  Location[facility] ENDOSCOPY;  Service: Endoscopy;  Laterality: N/A;  . ESOPHAGOGASTRODUODENOSCOPY N/A 06/04/2016   Procedure: ESOPHAGOGASTRODUODENOSCOPY (EGD);  Surgeon: Lollie Sails, MD;  Location: [facility] ENDOSCOPY;  Service: Endoscopy;  Laterality: N/A;  . laparoscopic resection of liver    . left shoulder    . PROSTATE BIOPSY      Family History  Problem Relation Age of Onset  . Diabetes Mother   . Hypertension Mother   . Heart disease Brother   . Hypertension Brother   . Hypertension Brother   . Heart attack Brother     Social History   Socioeconomic History  . Marital status: Married    Spouse name: Not on file  . Number of children: Not on file  . Years of education: Not on file  . Highest  education level: Not on file  Occupational History  . Not on file  Social Needs  . Financial resource strain: Not on file  . Food insecurity:    Worry: Not on file    Inability: Not on file  . Transportation needs:    Medical: Not on file    Non-medical: Not on file  Tobacco Use  . Smoking status: Former Smoker    Last attempt to quit: 02/25/1998    Years since quitting: 19.5  . Smokeless tobacco: Never Used  Substance and Sexual Activity  . Alcohol use: No    Alcohol/week:  0.0 oz    Comment: quit in 1999  was not a heavy drinker  . 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 Sexual activity: Never    Partners: Female  Lifestyle  . Physical activity:    Days per week: Not on file    Minutes per session: Not on file  . Stress: Not on file  Relationships  . Social connections:    Talks on phone: Not on file    Gets together: Not on file    Attends religious service: Not on file    Active member of club or organization: Not on file    Attends meetings of clubs or organizations: Not on file    Relationship status: Not on file  . Intimate partner violence:    Fear of current or ex partner: Not on file    Emotionally abused: Not on file    Physically abused: Not on file    Forced sexual activity: Not on file  Other Topics Concern  . Not on file  Social History Narrative   Married with 1 child           Current Outpatient Medications:  .  aspirin 81 MG tablet, Take 81 mg by mouth daily., Disp: , Rfl:  .  loperamide (IMODIUM) 2 MG capsule, Take by mouth., Disp: , Rfl:  .  metFORMIN (GLUCOPHAGE) 500 MG tablet, Take 1 tablet (500 mg total) by mouth 2 (two) times daily with a meal., Disp: 180 tablet, Rfl: 0 .  pantoprazole (PROTONIX) 40 MG tablet, Take 1 tablet (40 mg total) by mouth daily., Disp: 30 tablet, Rfl: 0 .  magnesium oxide (MAG-OX) 400 MG tablet, Take by mouth., Disp: , Rfl:   No Known Allergies   ROS  Constitutional: Negative for fever, positive for  weight change.  Respiratory: Negative for cough and shortness of breath.   Cardiovascular: Negative for chest pain or palpitations.  Gastrointestinal: Negative for abdominal pain, no bowel changes.  Musculoskeletal: Negative for gait problem or joint swelling.  Skin: Negative for rash.   Neurological: Negative for dizziness or headache.  No other specific complaints in a complete review of systems (except as listed in HPI above).   Objective  Vitals:   08/30/17  1121  BP: (!) 104/20  Pulse: 69  Resp: 16  Temp: 98.1 F (36.7 C)  TempSrc: Oral  SpO2: 97%  Weight: 157 lb 11.2 oz (71.5 kg)  Height: 5\' 8"  (1.727 m)    Body mass index is 23.98 kg/m.  Physical Exam  Constitutional: Patient appears well-developed and temporal waiting, very thin ( features of malnutrition)  No distress.  HEENT: head atraumatic, normocephalic, pupils equal and reactive to light,  neck supple, throat within normal limits Cardiovascular: Normal rate, regular rhythm and normal heart sounds.  No murmur heard. No BLE edema. Pulmonary/Chest:  Effort normal and breath sounds normal. No respiratory distress. Abdominal: Soft.  There is no tenderness. Scars well healed Psychiatric: Patient has a normal mood and affect. behavior is normal. Judgment and thought content normal.  Recent Results (from the past 2160 hour(s))  POCT HgB A1C     Status: None   Collection Time: 08/30/17 11:34 AM  Result Value Ref Range   Hemoglobin A1C 5.9      PHQ2/9: Depression screen [facility] 2/9 08/30/2017 06/13/2017 05/24/2017 02/15/2017 12/14/2016  Decreased Interest 0 0 0 0 0  Down, Depressed, Hopeless 0 0 0 0 0  PHQ - 2 Score 0 0 0 0 0     Fall Risk: Fall Risk  08/30/2017 06/13/2017 05/24/2017 02/15/2017 12/14/2016  Falls in the past year? No No No No No     Functional Status Survey: Is the patient deaf or have difficulty hearing?: No Does the patient have difficulty seeing, even when wearing glasses/contacts?: Yes(Prescription glasses) Does the patient have difficulty concentrating, remembering, or making decisions?: No Does the patient have difficulty walking or climbing stairs?: No Does the patient have difficulty dressing or bathing?: No Does the patient have difficulty doing errands alone such as visiting a doctor's office or shopping?: No   Assessment & Plan    1. Dyslipidemia associated with type 2 diabetes mellitus (HCC)  - POCT HgB A1C 5.9%, losing weight, we will stop  glipizide  - metFORMIN (GLUCOPHAGE) 500 MG tablet; Take 1 tablet (500 mg total) by mouth 2 (two) times daily with a meal.  Dispense: 180 tablet; Refill: 1  2. Chronic hepatitis C without hepatic coma (HCC)  Treated but developed liver cancer afterwards  3. Liver cancer, primary, with metastasis from liver to other site [facility])  Going to [facility] , and radiation with Dr. Donella Stade   4. Mild protein-calorie malnutrition ([place])  He has lost 20 over the past 18 months, but used to be around 184 before he got sick, he has lost 9 lbs in the past 3 months and has temporal waiting.   5. Hypotension due to drugs  Stop lisinopril, lost a lot of weight and bp has been low  6. Gastroesophageal reflux disease, esophagitis presence not specified  - pantoprazole (PROTONIX) 40 MG tablet; Take 1 tablet (40 mg total) by mouth daily.  Dispense: 90 tablet; Refill: 1  7. Hyperthyroidism without crisis  He states no longer taking medication , states stopped by endocrinologist at [facility]

## 2017-09-13 ENCOUNTER — Ambulatory Visit: Payer: BLUE CROSS/BLUE SHIELD | Admitting: Radiation Oncology

## 2017-09-18 ENCOUNTER — Ambulatory Visit
Admission: RE | Admit: 2017-09-18 | Discharge: 2017-09-18 | Disposition: A | Discharge status: Home / Self Care | Payer: BLUE CROSS/BLUE SHIELD | Source: Ambulatory Visit | Attending: Radiation Oncology | Admitting: Radiation Oncology

## 2017-09-18 ENCOUNTER — Encounter: Payer: Self-pay | Admitting: Radiation Oncology

## 2017-09-18 ENCOUNTER — Other Ambulatory Visit: Payer: Self-pay

## 2017-09-18 VITALS — BP 120/62 | Temp 97.0°F | Resp 18 | Wt 159.9 lb

## 2017-09-18 DIAGNOSIS — C7801 Secondary malignant neoplasm of right lung: Secondary | ICD-10-CM | POA: Insufficient documentation

## 2017-09-18 DIAGNOSIS — Z923 Personal history of irradiation: Secondary | ICD-10-CM | POA: Insufficient documentation

## 2017-09-18 DIAGNOSIS — C228 Malignant neoplasm of liver, primary, unspecified as to type: Secondary | ICD-10-CM

## 2017-09-18 DIAGNOSIS — C220 Liver cell carcinoma: Secondary | ICD-10-CM | POA: Insufficient documentation

## 2017-09-18 NOTE — Progress Notes (Signed)
Radiation Oncology Follow up Note  Name: Rodney Moody   Date:   09/18/2017 MRN:  [id number] DOB: 07/14/1945    This 72 y.o. male presents to the clinic today for one-month follow-up status post palliative radiation therapy to his right lung hilum for metastatic hepatocellular carcinoma.  REFERRING PROVIDER: Roselee Nova, MD  HPI: patient is a 72 year old male now out 1 month having completed palliative radiation therapy to his right hilum for metastatic hepatocellular carcinoma..he is currently being treated at [facility] on study med [id number]. He is doing well he specifically denies cough hemoptysis or chest tightness.he recently had repeat imaging at [facility] with chest showing a 1.3 cm short axis pretracheal lymph node unchanged with no new hilar or mediastinal or axillary lymphadenopathy.his abdominal CT scan shows status post left hepatectomy with no evidence of HCC.  COMPLICATIONS OF TREATMENT: none  FOLLOW UP COMPLIANCE: keeps appointments   PHYSICAL EXAM:  BP 120/62   Temp (!) 97 F (36.1 C)   Resp 18   Wt 159 lb 15.1 oz (72.6 kg)   BMI 24.32 kg/m  Well-developed well-nourished patient in NAD. HEENT reveals PERLA, EOMI, discs not visualized.  Oral cavity is clear. No oral mucosal lesions are identified. Neck is clear without evidence of cervical or supraclavicular adenopathy. Lungs are clear to A&P. Cardiac examination is essentially unremarkable with regular rate and rhythm without murmur rub or thrill. Abdomen is benign with no organomegaly or masses noted. Motor sensory and DTR levels are equal and symmetric in the upper and lower extremities. Cranial nerves II through XII are grossly intact. Proprioception is intact. No peripheral adenopathy or edema is identified. No motor or sensory levels are noted. Crude visual fields are within normal range.  RADIOLOGY RESULTS: CT scans reports reviewed and compatible with the above-stated findings  PLAN: present time patient is doing extremely  well continues under study at [facility]. I'm please was overall progress as well as a CT results. I've asked to see him back in 6 months for follow-up. He continues close follow-up care at [facility] Patient is to call with any concerns.  I would like to take this opportunity to thank you for allowing me to participate in the care of your patient.Noreene Filbert, MD

## 2017-11-07 ENCOUNTER — Ambulatory Visit: Payer: Self-pay | Admitting: Family Medicine

## 2017-11-08 ENCOUNTER — Ambulatory Visit: Payer: BLUE CROSS/BLUE SHIELD | Admitting: Nurse Practitioner

## 2017-11-08 ENCOUNTER — Encounter: Payer: Self-pay | Admitting: Nurse Practitioner

## 2017-11-08 VITALS — BP 100/60 | HR 72 | Temp 98.1°F | Resp 16 | Ht 68.0 in | Wt 155.3 lb

## 2017-11-08 DIAGNOSIS — R053 Chronic cough: Secondary | ICD-10-CM

## 2017-11-08 DIAGNOSIS — C220 Liver cell carcinoma: Secondary | ICD-10-CM

## 2017-11-08 DIAGNOSIS — R05 Cough: Secondary | ICD-10-CM

## 2017-11-08 MED ORDER — BENZONATATE 100 MG PO CAPS: 100.0000 mg | ORAL_CAPSULE | Freq: Two times a day (BID) | ORAL | 0 refills | Status: DC

## 2017-11-08 NOTE — Progress Notes (Addendum)
Name: Rodney Moody   MRN: [id number]    DOB: Feb 25, 1946   Date:11/08/2017       Progress Note  Subjective  Chief Complaint  Chief Complaint  Patient presents with  . Cough    He has had a persistant productive cough x 2 months and it is gradually worsening. He thought it was his allergies and consulted with the pharmacy and they recommended allergy medication that he could try. Watery eyes and nasal congestion resolved, but he cannot get rid of the cough.    HPI States coughing started in the end of April thought it was related to pollen so started taking allergy pills OTC states it stopped runny nose but cough has persisted and has been worsening in the last few weeks- coughing up clear phlegm. States felt like something was in his throat a few weeks ago but that has gone away. No choking.  Patient is being treated for hepatocellular carcinoma last note in 7/2/019 notes states lung metastases with Dr. Leamon Arnt.   Patient states was treated for right lung mass- 03/2017. - Dr. Donella Stade   Patient Active Problem List   Diagnosis Date Noted  . Vitamin D deficiency 08/30/2017  . H/O malignant neoplasm of prostate 08/30/2017  . Hyperthyroidism without crisis 12/28/2016  . Chronic viral hepatitis C ([place]) 02/07/2016  . Low back pain 12/16/2015  . Hepatocellular carcinoma ([place]) 08/16/2015  . Elevated liver enzymes 03/18/2015  . GERD (gastroesophageal reflux disease) 11/19/2014  . Dyslipidemia associated with type 2 diabetes mellitus ([place]) 11/19/2014  . Prostate cancer ([place]) 11/19/2014  . Dyslipidemia 11/19/2014  . Hyperlipidemia, unspecified 11/19/2014    Past Medical History:  Diagnosis Date  . Diabetes mellitus without complication ([place])    borderline Diabetes, but no medication  . GERD (gastroesophageal reflux disease)   . Hepatitis B   . Hepatitis C   . Prostate cancer [facility])     Past Surgical History:  Procedure Laterality Date  . COLONOSCOPY W/ BIOPSIES  2014   Patient has  polyps-benign  . COLONOSCOPY WITH PROPOFOL N/A 06/04/2016   Procedure: COLONOSCOPY WITH PROPOFOL;  Surgeon: Lollie Sails, MD;  Location: [facility] ENDOSCOPY;  Service: Endoscopy;  Laterality: N/A;  . ESOPHAGOGASTRODUODENOSCOPY N/A 06/04/2016   Procedure: ESOPHAGOGASTRODUODENOSCOPY (EGD);  Surgeon: Lollie Sails, MD;  Location: [facility] ENDOSCOPY;  Service: Endoscopy;  Laterality: N/A;  . laparoscopic resection of liver    . left shoulder    . PROSTATE BIOPSY      Social History   Tobacco Use  . Smoking status: Former Smoker    Last attempt to quit: 02/25/1998    Years since quitting: 19.7  . Smokeless tobacco: Never Used  Substance Use Topics  . Alcohol use: No    Alcohol/week: 0.0 oz    Comment: quit in 1999  was not a heavy drinker     Current Outpatient Medications:  .  aspirin 81 MG tablet, Take 81 mg by mouth daily., Disp: , Rfl:  .  lisinopril (PRINIVIL,ZESTRIL) 5 MG tablet, Take 5 mg by mouth daily., Disp: , Rfl: 3 .  loperamide (IMODIUM) 2 MG capsule, Take by mouth., Disp: , Rfl:  .  metFORMIN (GLUCOPHAGE) 500 MG tablet, Take 1 tablet (500 mg total) by mouth 2 (two) times daily with a meal., Disp: 180 tablet, Rfl: 1 .  pantoprazole (PROTONIX) 40 MG tablet, Take 1 tablet (40 mg total) by mouth daily., Disp: 90 tablet, Rfl: 1  No Known Allergies  ROS  No other specific complaints in a complete review of systems (except as listed in HPI above).  Objective  Vitals:   11/08/17 1504  BP: 100/60  Pulse: 72  Resp: 16  Temp: 98.1 F (36.7 C)  TempSrc: Oral  SpO2: 99%  Weight: 155 lb 4.8 oz (70.4 kg)  Height: 5\' 8"  (1.727 m)    Body mass index is 23.61 kg/m.  Nursing Note and Vital Signs reviewed.  Physical Exam   Constitutional: Patient appears well-developed and well-nourished.  No distress.  HEENT: head atraumatic, normocephalic, pupils equal and reactive to light, , no maxillary or frontal sinus tenderness , neck supple without lymphadenopathy, oropharynx  pink and moist without exudate, no nasal discharge Cardiovascular: Normal rate, regular rhythm, S1/S2 present.  No murmur or rub heard.  Pulmonary/Chest: Effort normal and breath sounds clear. No respiratory distress or retractions. Psychiatric: Patient has a normal mood and affect. behavior is normal. Judgment and thought content normal.  No results found for this or any previous visit (from the past 72 hour(s)).  Assessment & Plan  1. Chronic cough Patient has mets to lungs in past but CT from 08/2017 was negative for lung mets  - benzonatate (TESSALON PERLES) 100 MG capsule; Take 1 capsule (100 mg total) by mouth 2 (two) times daily.  Dispense: 30 capsule; Refill: 0 - Ambulatory referral to Pulmonology  2. Hepatocellular carcinoma ([place]) Consideration due to chronic cough and hx of mets to lungs - Ambulatory referral to Pulmonology    -Red flags and when to present for emergency care or RTC including fever >101.44F, chest pain, shortness of breath, new/worsening/un-resolving symptoms,  reviewed with patient at time of visit. Follow up and care instructions discussed and provided in AVS. -Reviewed Health Maintenance:  -------------------------------- I have reviewed this encounter including the documentation in this note and/or discussed this patient with the provider, Suezanne Cheshire DNP AGNP-C. I am certifying that I agree with the content of this note as supervising physician. Enid Derry, [facility] 11/11/2017, 5:32 PM

## 2017-11-19 NOTE — Progress Notes (Signed)
[name] Consultation      Assessment and Plan:  Intractable cough. - Cough for 2 months duration, suspect that this is postinfectious in etiology. - We will start Tessalon 200 mg 3 times daily, will also start narcotic cough syrup 10 mL's at night to help with sleep. - If the cough is not improved in 6 weeks time the patient is asked to come back and see [place] for further work-up.  Hepatocellular cancer with metastatic disease to lungs. - Mediastinal lymphadenopathy, status post radiation. - Continue to follow-up with oncology.  Meds ordered this encounter  Medications  . benzonatate (TESSALON) 200 MG capsule    Sig: Take 1 capsule (200 mg total) by mouth 3 (three) times daily.    Dispense:  90 capsule    Refill:  1  . HYDROcodone-acetaminophen (HYCET) 7.5-325 mg/15 ml solution    Sig: Take 10 mLs by mouth at bedtime.    Dispense:  300 mL    Refill:  0   Return if symptoms worsen or fail to improve in 6 weeks. .    Date: 11/20/2017  MRN# [id number] JACQUEL Moody [birth year]  Referring Physician: NP [place].  Rodney Moody is a 72 y.o. old male seen in consultation for chief complaint of:    Chief Complaint  Patient presents with  . Consult    prod cough w/clear mucus:     HPI:   The patient is a 72 year old male with a history of hepatitis C, metastatic hepatocellular cancer, status post left hepatectomy in 2017. He has had lung mets (mediastinal lymph node) requiring radiation in 03/2017.  He has a cough that started about 2 months ago, it started as a cold, which went away but the cough persisted. He has been having a lot of phelgm, then has paroxysms of cough.  Seems a bit better with cough drop, drinking water.  Made worse by laying down at night.  Robitussin helps minimally, nyquil did not help.   No hemoptysis or weight loss. No Tb exposure. He is works at [employer].  Has reflux controlled by protonix.  Denies sinus drainage.  He snores at  night, no apneas, not sleepy during the day.   **CT chest 04/11/2017>> lungs are normal.  Enlarged right 3 cm paratracheal node.   PMHX:   Past Medical History:  Diagnosis Date  . Diabetes mellitus without complication ([place])    borderline Diabetes, but no medication  . GERD (gastroesophageal reflux disease)   . Hepatitis B   . Hepatitis C   . Prostate cancer [facility])    Surgical Hx:  Past Surgical History:  Procedure Laterality Date  . COLONOSCOPY W/ BIOPSIES  2014   Patient has polyps-benign  . COLONOSCOPY WITH PROPOFOL N/A 06/04/2016   Procedure: COLONOSCOPY WITH PROPOFOL;  Surgeon: Lollie Sails, MD;  [facility] ENDOSCOPY;  Service: Endoscopy;  Laterality: N/A;  . ESOPHAGOGASTRODUODENOSCOPY N/A 06/04/2016   Procedure: ESOPHAGOGASTRODUODENOSCOPY (EGD);  Surgeon: Lollie Sails, MD;  Location: [facility] ENDOSCOPY;  Service: Endoscopy;  Laterality: N/A;  . laparoscopic resection of liver    . left shoulder    . PROSTATE BIOPSY     Family Hx:  Family History  Problem Relation Age of Onset  . Diabetes Mother   . Hypertension Mother   . Heart disease Brother   . Hypertension Brother   . Hypertension Brother   . Heart attack Brother    Social Hx:   Social History   Tobacco Use  .  Smoking status: Former Smoker    Last attempt to quit: 02/25/1998    Years since quitting: 19.7  . Smokeless tobacco: Never Used  Substance Use Topics  . Alcohol use: No    Alcohol/week: 0.0 oz    Comment: quit in 1999  was not a heavy drinker  . 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  aspirin 81 MG tablet, Take 81 mg by mouth daily., Disp: , Rfl:  .  benzonatate (TESSALON PERLES) 100 MG capsule, Take 1 capsule (100 mg total) by mouth 2 (two) times daily., Disp: 30 capsule, Rfl: 0 .  lisinopril (PRINIVIL,ZESTRIL) 5 MG tablet, Take 5 mg by mouth daily., Disp: , Rfl: 3 .  loperamide (IMODIUM) 2 MG capsule, Take by  mouth., Disp: , Rfl:  .  metFORMIN (GLUCOPHAGE) 500 MG tablet, Take 1 tablet (500 mg total) by mouth 2 (two) times daily with a meal., Disp: 180 tablet, Rfl: 1 .  pantoprazole (PROTONIX) 40 MG tablet, Take 1 tablet (40 mg total) by mouth daily., Disp: 90 tablet, Rfl: 1   Allergies:  Patient has no known allergies.  Review of Systems: Gen:  Denies  fever, sweats, chills HEENT: Denies blurred vision, double vision. bleeds, sore throat Cvc:  No dizziness, chest pain. Resp:   Denies cough or sputum production, shortness of breath Gi: Denies swallowing difficulty, stomach pain. Gu:  Denies bladder incontinence, burning urine Ext:   No Joint pain, stiffness. Skin: No skin rash,  hives  Endoc:  No polyuria, polydipsia. Psych: No depression, insomnia. Other:  All other systems were reviewed with the patient and were negative other that what is mentioned in the HPI.   Physical Examination:   VS: BP 128/62 (BP Location: Left Arm, Cuff Size: Normal)   Pulse 69   Ht 5\' 8"  (1.727 m)   Wt 155 lb (70.3 kg)   SpO2 99%   BMI 23.57 kg/m   General Appearance: No distress  Neuro:without focal findings,  speech normal,  HEENT: PERRLA, EOM intact.   Pulmonary: normal breath sounds, No wheezing.  CardiovascularNormal S1,S2.  No m/r/g.   Abdomen: Benign, Soft, non-tender. Renal:  No costovertebral tenderness  GU:  No performed at this time. Endoc: No evident thyromegaly, no signs of acromegaly. Skin:   warm, no rashes, no ecchymosis  Extremities: normal, no cyanosis, clubbing.  Other findings:    LABORATORY PANEL:   CBC No results for input(s): WBC, HGB, HCT, PLT in the last 168 hours. ------------------------------------------------------------------------------------------------------------------  Chemistries  No results for input(s): NA, K, CL, CO2, GLUCOSE, BUN, CREATININE, CALCIUM, MG, AST, ALT, ALKPHOS, BILITOT in the last 168 hours.  Invalid input(s):  GFRCGP ------------------------------------------------------------------------------------------------------------------  Cardiac Enzymes No results for input(s): TROPONINI in the last 168 hours. ------------------------------------------------------------  RADIOLOGY:  No results found.     Thank  you for the consultation and for allowing [facility] to assist in the care of your patient. Our recommendations are noted above.  Please contact us if we can be of further service.   Marda Stalker, M.D., F.C.C.P.  Board Certified in Internal Medicine, Pulmonary Medicine, [place], and Sleep Medicine.  [facility] Office Number: [phone]   11/20/2017

## 2017-11-20 ENCOUNTER — Ambulatory Visit: Payer: BLUE CROSS/BLUE SHIELD | Admitting: Internal Medicine

## 2017-11-20 ENCOUNTER — Encounter: Payer: Self-pay | Admitting: Internal Medicine

## 2017-11-20 VITALS — BP 128/62 | HR 69 | Ht 68.0 in | Wt 155.0 lb

## 2017-11-20 DIAGNOSIS — J418 Mixed simple and mucopurulent chronic bronchitis: Secondary | ICD-10-CM

## 2017-11-20 DIAGNOSIS — R059 Cough, unspecified: Secondary | ICD-10-CM

## 2017-11-20 DIAGNOSIS — R05 Cough: Secondary | ICD-10-CM

## 2017-11-20 MED ORDER — BENZONATATE 200 MG PO CAPS: 200.0000 mg | ORAL_CAPSULE | Freq: Three times a day (TID) | ORAL | 1 refills | Status: DC

## 2017-11-20 MED ORDER — HYDROCODONE-ACETAMINOPHEN 7.5-325 MG/15ML PO SOLN: 10.0000 mL | Freq: Every day | ORAL | 0 refills | Status: AC

## 2017-11-20 NOTE — Patient Instructions (Addendum)
Start tessalon cough drops 2 tablets three times per day.  Will start tussionex cough syrup at night for sleep.   Come back in 6 weeks if cough not resolved for further workup.

## 2017-12-31 ENCOUNTER — Encounter: Payer: Self-pay | Admitting: Family Medicine

## 2018-01-03 ENCOUNTER — Encounter: Payer: Self-pay | Admitting: Family Medicine

## 2018-01-03 ENCOUNTER — Ambulatory Visit: Payer: BLUE CROSS/BLUE SHIELD | Admitting: Family Medicine

## 2018-01-03 VITALS — BP 122/62 | HR 71 | Temp 97.6°F | Resp 14 | Ht 68.0 in | Wt 152.9 lb

## 2018-01-03 DIAGNOSIS — E441 Mild protein-calorie malnutrition: Secondary | ICD-10-CM

## 2018-01-03 DIAGNOSIS — E785 Hyperlipidemia, unspecified: Secondary | ICD-10-CM

## 2018-01-03 DIAGNOSIS — K219 Gastro-esophageal reflux disease without esophagitis: Secondary | ICD-10-CM

## 2018-01-03 DIAGNOSIS — C781 Secondary malignant neoplasm of mediastinum: Secondary | ICD-10-CM

## 2018-01-03 DIAGNOSIS — Z8619 Personal history of other infectious and parasitic diseases: Secondary | ICD-10-CM

## 2018-01-03 DIAGNOSIS — E1169 Type 2 diabetes mellitus with other specified complication: Secondary | ICD-10-CM | POA: Diagnosis not present

## 2018-01-03 DIAGNOSIS — C220 Liver cell carcinoma: Secondary | ICD-10-CM

## 2018-01-03 DIAGNOSIS — I700 Atherosclerosis of aorta: Secondary | ICD-10-CM | POA: Insufficient documentation

## 2018-01-03 DIAGNOSIS — Z23 Encounter for immunization: Secondary | ICD-10-CM

## 2018-01-03 LAB — POCT GLYCOSYLATED HEMOGLOBIN (HGB A1C): Hemoglobin A1C: 6.3 % — AB (ref 4.0–5.6)

## 2018-01-03 NOTE — Progress Notes (Signed)
Name: Rodney Moody   MRN: [id number]    DOB: 1945-11-13   Date:01/03/2018       Progress Note  Subjective  Chief Complaint  Chief Complaint  Patient presents with  . Follow-up    4 month f/u  . Hypertension  . Gastroesophageal Reflux  . Diabetes  . Hypothyroidism  . Dyslipidemia  . Malnutrition  . Cough    patient stated that he started having a cough with taking the Lisinopril. He has stopped taking it and was given some Tessalon Perles by Dr. Juanell Fairly. patient stated that he also has been having some issues with dry mouth    HPI  History of hepatitis C, with liver cancer and mets: seeing Oncologist at [facility], Dr. Leamon Arnt also radiation oncologist Dr. Donella Stade, he goes to [facility] once a month for treatment. He states since started on treatment he has episodes of diarrhea, followed by periods of constipation ( taking imodium prn). He states it is not caused by metformin. He denies pain, jaundice or tremors, no confusion, urine is normal color, denies acholic stools. He states treatment affects his appetite and his initial weight was 184 lbs in 2017 before hepatic cancer was removed. He is trying to eat a lot of protein, discussed protein shakes.   Chronic cough: started May 2019, seen by Clyde Lundborg and also seen pulmonologist Dr. Felicie Morn. He is feeling better, still has a mild cough at night and dry mouth in am. Discussed sleep study but he wants to hold off for now. He stopped ACE last week - advised by [facility]. We will continue to monitor.   DMII: hgbA1C was 7.0% July 2018, but has lost weight, 04/2017 hgbA1C 6.5%, 5.9% today is 6.3% . He has a poor appetite but is trying to eat - states getting a little better. Glucose is 105-120's . He denies polyphagia, polydipsia or polyuria.   GERD: taking medication and denies side effects, he has a dry cough when goes to bed, ace just stopped by [facility], I had advised him to stop in May but he was still taking it .  Hyperthyroidism: off medication,  no palpitation, but has some diarrhea, last T[facility] done at [facility] 11/2017 was normal.   Dyslipidemia: not currently on medication, has history of liver cancer and is not interested even though explained to him today that he has aorta atherosclerosis also. He has been taking aspirin 81 mg daily   Malnutrition: multifactorial, started to lose weight when on treatment for cancer that started in 2017 . Baseline weight around 184 lbs, and is down to 152.9 lbs. He states appetite is not very good, but he eats anyways.   Patient Active Problem List   Diagnosis Date Noted  . Atherosclerosis of abdominal aorta ([place]) 01/03/2018  . 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 Vitamin D deficiency 08/30/2017  . H/O malignant neoplasm of prostate 08/30/2017  . Hyperthyroidism without crisis 12/28/2016  . Chronic viral hepatitis C ([place]) 02/07/2016  . Low back pain 12/16/2015  . Hepatocellular carcinoma ([place]) 08/16/2015  . Elevated liver enzymes 03/18/2015  . GERD (gastroesophageal reflux disease) 11/19/2014  . Dyslipidemia associated with type 2 diabetes mellitus ([place]) 11/19/2014  . Prostate cancer ([place]) 11/19/2014  . Dyslipidemia 11/19/2014  . Hyperlipidemia, unspecified 11/19/2014    Past Surgical History:  Procedure Laterality Date  . COLONOSCOPY W/ BIOPSIES  2014   Patient has polyps-benign  . COLONOSCOPY WITH PROPOFOL N/A 06/04/2016   Procedure: COLONOSCOPY WITH PROPOFOL;  Surgeon: Billie Ruddy  Gustavo Lah, MD;  Location: [facility] ENDOSCOPY;  Service: Endoscopy;  Laterality: N/A;  . ESOPHAGOGASTRODUODENOSCOPY N/A 06/04/2016   Procedure: ESOPHAGOGASTRODUODENOSCOPY (EGD);  Surgeon: Lollie Sails, MD;  Location: [facility] ENDOSCOPY;  Service: Endoscopy;  Laterality: N/A;  . laparoscopic resection of liver    . left shoulder    . PROSTATE BIOPSY      Family History  Problem Relation Age of Onset  . Diabetes Mother   . Hypertension Mother   . Heart disease Brother   . Hypertension Brother    . Hypertension Brother   . Heart attack Brother     Social History   Socioeconomic History  . Marital status: Married    Spouse name: Enid Derry  . Number of children: 1  . Years of education: Not on file  . Highest education level: High school graduate  Occupational History  . Not on file  Social Needs  . Financial resource strain: Patient refused  . Food insecurity:    Worry: Never true    Inability: Never true  . Transportation needs:    Medical: No    Non-medical: No  Tobacco Use  . Smoking status: Former Smoker    Last attempt to quit: 02/25/1998    Years since quitting: 19.8  . Smokeless tobacco: Never Used  Substance and Sexual Activity  . Alcohol use: No    Alcohol/week: 0.0 standard drinks    Comment: quit in 1999  was not a heavy drinker  . 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 Sexual activity: Never    Partners: Female  Lifestyle  . Physical activity:    Days per week: 5 days    Minutes per session: 150+ min  . Stress: Not at all  Relationships  . Social connections:    Talks on phone: Twice a week    Gets together: Once a week    Attends religious service: More than 4 times per year    Active member of club or organization: Yes    Attends meetings of clubs or organizations: More than 4 times per year    Relationship status: Married  . Intimate partner violence:    Fear of current or ex partner: No    Emotionally abused: No    Physically abused: No    Forced sexual activity: No  Other Topics Concern  . Not on file  Social History Narrative   Married with 1 child          Current Outpatient Medications:  .  aspirin 81 MG tablet, Take 81 mg by mouth daily., Disp: , Rfl:  .  pantoprazole (PROTONIX) 40 MG tablet, Take 1 tablet (40 mg total) by mouth daily., Disp: 90 tablet, Rfl: 1 .  loperamide (IMODIUM) 2 MG capsule, Take by mouth., Disp: , Rfl:  .  metFORMIN (GLUCOPHAGE) 500 MG tablet, Take 1 tablet (500 mg total) by  mouth 2 (two) times daily with a meal., Disp: 180 tablet, Rfl: 1  Allergies  Allergen Reactions  . Lisinopril Cough     ROS  Constitutional: Negative for fever or weight change.  Respiratory: positive  for very mild cough but no shortness of breath.   Cardiovascular: Negative for chest pain or palpitations.  Gastrointestinal: Negative for abdominal pain, no bowel changes.  Musculoskeletal: Negative for gait problem or joint swelling.  Skin: Negative for rash.  Neurological: Negative for dizziness or headache.  No other specific complaints in a complete review of  systems (except as listed in HPI above).  Objective  Vitals:   01/03/18 1128  BP: 122/62  Pulse: 71  Resp: 14  Temp: 97.6 F (36.4 C)  TempSrc: Oral  SpO2: 99%  Weight: 152 lb 14.4 oz (69.4 kg)  Height: 5\' 8"  (1.727 m)    Body mass index is 23.25 kg/m.  Physical Exam  Constitutional: Patient appears well-developed , thin with temporal waisting. No distress.  HEENT: head atraumatic, normocephalic, pupils equal and reactive to light,neck supple, throat within normal limits Cardiovascular: Normal rate, regular rhythm and normal heart sounds.  No murmur heard. No BLE edema. Pulmonary/Chest: Effort normal and breath sounds normal. No respiratory distress. Abdominal: Soft.  There is no tenderness. Psychiatric: Patient has a normal mood and affect. behavior is normal. Judgment and thought content normal.  Recent Results (from the past 2160 hour(s))  POCT glycosylated hemoglobin (Hb A1C)     Status: Abnormal   Collection Time: 01/03/18 11:52 AM  Result Value Ref Range   Hemoglobin A1C 6.3 (A) 4.0 - 5.6 %   HbA1c POC (<> result, manual entry)     HbA1c, POC (prediabetic range)     HbA1c, POC (controlled diabetic range)      Diabetic Foot Exam: Diabetic Foot Exam - Simple   Simple Foot Form Diabetic Foot exam was performed with the following findings:  Yes 01/03/2018 11:54 AM  Visual Inspection See comments:   Yes Sensation Testing Intact to touch and monofilament testing bilaterally:  Yes Pulse Check Posterior Tibialis and Dorsalis pulse intact bilaterally:  Yes Comments Long nails      PHQ2/9: Depression screen [facility] 2/9 01/03/2018 09/18/2017 08/30/2017 06/13/2017 05/24/2017  Decreased Interest 0 0 0 0 0  Down, Depressed, Hopeless 0 0 0 0 0  PHQ - 2 Score 0 0 0 0 0  Altered sleeping 2 - - - -  Tired, decreased energy 0 - - - -  Change in appetite 0 - - - -  Feeling bad or failure about yourself  0 - - - -  Trouble concentrating 0 - - - -  Moving slowly or fidgety/restless 0 - - - -  Suicidal thoughts 0 - - - -  PHQ-9 Score 2 - - - -  Difficult doing work/chores Not difficult at all - - - -    Fall Risk: Fall Risk  01/03/2018 11/08/2017 09/18/2017 08/30/2017 06/13/2017  Falls in the past year? No No No No No    Functional Status Survey: Is the patient deaf or have difficulty hearing?: No Does the patient have difficulty seeing, even when wearing glasses/contacts?: No Does the patient have difficulty concentrating, remembering, or making decisions?: No Does the patient have difficulty walking or climbing stairs?: No Does the patient have difficulty dressing or bathing?: No Does the patient have difficulty doing errands alone such as visiting a doctor's office or shopping?: No    Assessment & Plan  1. Dyslipidemia associated with type 2 diabetes mellitus (HCC)  - POCT glycosylated hemoglobin (Hb A1C)  2. Need for influenza vaccination  - Flu vaccine HIGH DOSE PF  3. Need for Tdap vaccination  - Tdap vaccine greater than or equal to 7yo IM  4. Need for shingles vaccine  - Varicella-zoster vaccine IM  5. Gastroesophageal reflux disease, esophagitis presence not specified   6. Dyslipidemia  He refuses statin therapy  7. Mild protein-calorie malnutrition ([place])  From cancer therapy   8. Hepatocellular carcinoma ([place])  S/p resection but still getting treatment  for  metastatic disease  9. History of hepatitis C  Treated at [place]. Atherosclerosis of aorta ([place])  Discussed statin therapy , but he refuses at this time. He is taking aspirin daily   12. 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 Rosine Abe

## 2018-02-11 ENCOUNTER — Other Ambulatory Visit: Payer: Self-pay | Admitting: Family Medicine

## 2018-02-11 DIAGNOSIS — K219 Gastro-esophageal reflux disease without esophagitis: Secondary | ICD-10-CM

## 2018-04-03 ENCOUNTER — Ambulatory Visit: Payer: BLUE CROSS/BLUE SHIELD | Admitting: Radiation Oncology

## 2018-04-12 ENCOUNTER — Encounter: Payer: Self-pay | Admitting: Emergency Medicine

## 2018-04-12 ENCOUNTER — Emergency Department
Admission: EM | Admit: 2018-04-12 | Discharge: 2018-04-12 | Disposition: A | Discharge status: Home / Self Care | Payer: BLUE CROSS/BLUE SHIELD | Attending: Emergency Medicine | Admitting: Emergency Medicine

## 2018-04-12 ENCOUNTER — Emergency Department: Payer: BLUE CROSS/BLUE SHIELD

## 2018-04-12 ENCOUNTER — Other Ambulatory Visit: Payer: Self-pay

## 2018-04-12 DIAGNOSIS — R059 Cough, unspecified: Secondary | ICD-10-CM

## 2018-04-12 DIAGNOSIS — J069 Acute upper respiratory infection, unspecified: Secondary | ICD-10-CM

## 2018-04-12 DIAGNOSIS — Z87891 Personal history of nicotine dependence: Secondary | ICD-10-CM | POA: Insufficient documentation

## 2018-04-12 DIAGNOSIS — E119 Type 2 diabetes mellitus without complications: Secondary | ICD-10-CM | POA: Diagnosis not present

## 2018-04-12 DIAGNOSIS — I2510 Atherosclerotic heart disease of native coronary artery without angina pectoris: Secondary | ICD-10-CM | POA: Diagnosis not present

## 2018-04-12 DIAGNOSIS — R05 Cough: Secondary | ICD-10-CM | POA: Insufficient documentation

## 2018-04-12 DIAGNOSIS — Z8546 Personal history of malignant neoplasm of prostate: Secondary | ICD-10-CM | POA: Diagnosis not present

## 2018-04-12 DIAGNOSIS — Z8529 Personal history of malignant neoplasm of other respiratory and intrathoracic organs: Secondary | ICD-10-CM | POA: Diagnosis not present

## 2018-04-12 DIAGNOSIS — M7918 Myalgia, other site: Secondary | ICD-10-CM | POA: Diagnosis present

## 2018-04-12 DIAGNOSIS — Z7982 Long term (current) use of aspirin: Secondary | ICD-10-CM | POA: Diagnosis not present

## 2018-04-12 LAB — COMPREHENSIVE METABOLIC PANEL
ALK PHOS: 72 U/L (ref 38–126)
ALT: 14 U/L (ref 0–44)
ANION GAP: 8 (ref 5–15)
AST: 26 U/L (ref 15–41)
Albumin: 4.3 g/dL (ref 3.5–5.0)
BILIRUBIN TOTAL: 0.8 mg/dL (ref 0.3–1.2)
BUN: 14 mg/dL (ref 8–23)
CALCIUM: 9.2 mg/dL (ref 8.9–10.3)
CO2: 26 mmol/L (ref 22–32)
Chloride: 100 mmol/L (ref 98–111)
Creatinine, Ser: 1.09 mg/dL (ref 0.61–1.24)
GFR calc Af Amer: >60 mL/min (ref >60)
GFR calc non Af Amer: >60 mL/min (ref >60)
GLUCOSE: 88 mg/dL (ref 70–99)
Potassium: 3.7 mmol/L (ref 3.5–5.1)
Sodium: 134 mmol/L — ABNORMAL LOW (ref 135–145)
TOTAL PROTEIN: 7.8 g/dL (ref 6.5–8.1)

## 2018-04-12 LAB — CBC WITH DIFFERENTIAL/PLATELET
Abs Immature Granulocytes: 0.01 10*3/uL (ref 0.00–0.07)
Basophils Absolute: 0 10*3/uL (ref 0.0–0.1)
Basophils Relative: 0 %
EOS ABS: 0.2 10*3/uL (ref 0.0–0.5)
EOS PCT: 5 %
HCT: 36.2 % — ABNORMAL LOW (ref 39.0–52.0)
Hemoglobin: 11 g/dL — ABNORMAL LOW (ref 13.0–17.0)
Immature Granulocytes: 0 %
Lymphocytes Relative: 13 %
Lymphs Abs: 0.6 10*3/uL — ABNORMAL LOW (ref 0.7–4.0)
MCH: 23.7 pg — AB (ref 26.0–34.0)
MCHC: 30.4 g/dL (ref 30.0–36.0)
MCV: 77.8 fL — AB (ref 80.0–100.0)
MONO ABS: 0.5 10*3/uL (ref 0.1–1.0)
Monocytes Relative: 11 %
Neutro Abs: 3.1 10*3/uL (ref 1.7–7.7)
Neutrophils Relative %: 71 %
PLATELETS: 190 10*3/uL (ref 150–400)
RBC: 4.65 MIL/uL (ref 4.22–5.81)
RDW: 14.8 % (ref 11.5–15.5)
WBC: 4.4 10*3/uL (ref 4.0–10.5)
nRBC: 0 % (ref 0.0–0.2)

## 2018-04-12 LAB — INFLUENZA PANEL BY PCR (TYPE A & B)
Influenza A By PCR: NEGATIVE
Influenza B By PCR: NEGATIVE

## 2018-04-12 LAB — TROPONIN I: Troponin I: <0.03 ng/mL (ref <0.03)

## 2018-04-12 MED ORDER — IOHEXOL 350 MG/ML SOLN
75.0000 mL | Freq: Once | INTRAVENOUS | Status: AC | PRN
  Administered 2018-04-12: 75 mL via INTRAVENOUS

## 2018-04-12 NOTE — ED Provider Notes (Signed)
[facility] Emergency Department Provider Note   ____________________________________________   First MD Initiated Contact with Patient 04/12/18 [phone]     (approximate)  I have reviewed the triage vital signs and the nursing notes.   HISTORY  Chief Complaint URI    Hpi DERMOT GREMILLION is a 72 y.o. male who reports he has been working out the rain for several days.  He is been feeling cold now he is aching all over has a cough productive of some clear phlegm and really is not having much of an appetite.  He just does not feel well.   Past Medical History:  Diagnosis Date  . Diabetes mellitus without complication ([place])    borderline Diabetes, but no medication  . GERD (gastroesophageal reflux disease)   . Hepatitis B   . Hepatitis C   . Prostate cancer [facility])     Patient Active Problem List   Diagnosis Date Noted  . Atherosclerosis of abdominal aorta ([place]) 01/03/2018  . 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 Vitamin D deficiency 08/30/2017  . H/O malignant neoplasm of prostate 08/30/2017  . Hyperthyroidism without crisis 12/28/2016  . Chronic viral hepatitis C ([place]) 02/07/2016  . Low back pain 12/16/2015  . Hepatocellular carcinoma ([place]) 08/16/2015  . Elevated liver enzymes 03/18/2015  . GERD (gastroesophageal reflux disease) 11/19/2014  . Dyslipidemia associated with type 2 diabetes mellitus ([place]) 11/19/2014  . Prostate cancer ([place]) 11/19/2014  . Dyslipidemia 11/19/2014  . Hyperlipidemia, unspecified 11/19/2014    Past Surgical History:  Procedure Laterality Date  . COLONOSCOPY W/ BIOPSIES  2014   Patient has polyps-benign  . COLONOSCOPY WITH PROPOFOL N/A 06/04/2016   Procedure: COLONOSCOPY WITH PROPOFOL;  Surgeon: Lollie Sails, MD;  Location: [facility] ENDOSCOPY;  Service: Endoscopy;  Laterality: N/A;  . ESOPHAGOGASTRODUODENOSCOPY N/A 06/04/2016   Procedure: ESOPHAGOGASTRODUODENOSCOPY (EGD);  Surgeon: Lollie Sails,  MD;  Location: [facility] ENDOSCOPY;  Service: Endoscopy;  Laterality: N/A;  . laparoscopic resection of liver    . left shoulder    . PROSTATE BIOPSY      Prior to Admission medications   Medication Sig Start Date End Date Taking? Authorizing Provider  aspirin 81 MG tablet Take 81 mg by mouth daily.    [provider]  loperamide (IMODIUM) 2 MG capsule Take by mouth.    [provider]  metFORMIN (GLUCOPHAGE) 500 MG tablet Take 1 tablet (500 mg total) by mouth 2 (two) times daily with a meal. 08/30/17 11/28/17  Steele Sizer, MD  pantoprazole (PROTONIX) 40 MG tablet TAKE 1 TABLET BY MOUTH EVERY DAY 02/12/18   Hubbard Hartshorn, FNP    Allergies Lisinopril  Family History  Problem Relation Age of Onset  . Diabetes Mother   . Hypertension Mother   . Heart disease Brother   . Hypertension Brother   . Hypertension Brother   . Heart attack Brother     Social History Social History   Tobacco Use  . Smoking status: Former Smoker    Last attempt to quit: 02/25/1998    Years since quitting: 20.1  . Smokeless tobacco: Never Used  Substance Use Topics  . Alcohol use: No    Alcohol/week: 0.0 standard drinks    Comment: quit in 1999  was not a heavy drinker  . 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 ENT: No sore throat.  Cardiovascular: Denies chest pain. Respiratory: Denies shortness of breath. Gastrointestinal: No abdominal pain.  No nausea, no vomiting.  No diarrhea.  No constipation. Genitourinary: Negative for dysuria. Musculoskeletal: Negative for back pain. Skin: Negative for rash. Neurological: Negative for headaches, focal weakness    ____________________________________________   PHYSICAL EXAM:  VITAL SIGNS: ED Triage Vitals  Enc Vitals Group     BP 04/12/18 0615 136/75     Pulse Rate 04/12/18 0615 74     Resp 04/12/18 0615 18     Temp 04/12/18 0615  98.5 F (36.9 C)     Temp Source 04/12/18 0615 Oral     SpO2 04/12/18 0615 96 %     Weight 04/12/18 0610 160 lb (72.6 kg)     Height 04/12/18 0610 5\' 8"  (1.727 m)     Head Circumference --      Peak Flow --      Pain Score 04/12/18 0610 7     Pain Loc --      Pain Edu? --      Excl. in [place]? --     Constitutional: Alert and oriented. Well appearing and in no acute distress. Eyes: Conjunctivae are normal.  Head: Atraumatic. Nose: No congestion/rhinnorhea. Mouth/Throat: Mucous membranes are moist.  Oropharynx non-erythematous. Neck: No stridor.  Cardiovascular: Normal rate, regular rhythm. Grossly normal heart sounds.  Good peripheral circulation. Respiratory: Normal respiratory effort.  No retractions. Lungs CTAB. Gastrointestinal: Soft and nontender. No distention. No abdominal bruits.  Musculoskeletal: No lower extremity tenderness nor edema.  No joint effusions. Neurologic:  Normal speech and language. No gross focal neurologic deficits are appreciated.  Skin:  Skin is warm, dry and intact. No rash noted. Psychiatric: Mood and affect are normal. Speech and behavior are normal.  ____________________________________________   LABS (all labs ordered are listed, but only abnormal results are displayed)  Labs Reviewed  CBC WITH DIFFERENTIAL/PLATELET - Abnormal; Notable for the following components:      Result Value   Hemoglobin 11.0 (*)    HCT 36.2 (*)    MCV 77.8 (*)    MCH 23.7 (*)    Lymphs Abs 0.6 (*)    All other components within normal limits  COMPREHENSIVE METABOLIC PANEL  INFLUENZA PANEL BY PCR (TYPE A & B)  TROPONIN I   ____________________________________________  EKG EKG read interpreted by me shows normal sinus rhythm rate of 100 right bundle branch block no acute ST-T changes  ____________________________________________  RADIOLOGY  ED MD interpretation:   Official radiology report(s): No results  found.  ____________________________________________   PROCEDURES    Procedures  Critical Care performed:   ____________________________________________   INITIAL IMPRESSION / ASSESSMENT AND PLAN / ED COURSE  Labs are pending Dr. Reita Cliche will check on him.         ____________________________________________   FINAL CLINICAL IMPRESSION(S) / ED DIAGNOSES  Final diagnoses:  Cough     ED Discharge Orders    None       Note:  This document was prepared using Dragon voice recognition software and may include unintentional dictation errors.    Nena Polio, MD 04/12/18 [phone]

## 2018-04-12 NOTE — Discharge Instructions (Addendum)
Your evaluated for cough and although no certain cause was found, your exam evaluation overall reassuring in the emergency department today.  As we discussed, this may be due to a virus.  Make sure that you are getting plenty of sleep and drinking plenty fluids.  Return to the emerge department immediately for any worsening condition including trouble breathing, shortness of breath, chest pain, dizziness or passing out, fever, or any other symptoms concerning to you.

## 2018-04-12 NOTE — ED Notes (Addendum)
Pt c/o generalized aches "all over", chills and congestion with cough for the last 3 days after getting "caught in the rain and cold", denies SOB/N/V/D, hx of controlled DM  Pt reports taking 2 regular tylenol this am

## 2018-04-12 NOTE — ED Notes (Signed)
Patient transported to CT 

## 2018-04-12 NOTE — ED Triage Notes (Signed)
Pt arrives ambulatory to triage with c/o "I got cold yesterday in the rain". Pt is in NAD.

## 2018-04-12 NOTE — ED Provider Notes (Signed)
[facility]  I accepted care from Dr. Rip Harbour ____________________________________________    LABS (pertinent positives/negatives)  I, Lisa Roca, MD have personally reviewed the lab reports noted below.  Labs Reviewed  COMPREHENSIVE METABOLIC PANEL - Abnormal; Notable for the following components:      Result Value   Sodium 134 (*)    All other components within normal limits  CBC WITH DIFFERENTIAL/PLATELET - Abnormal; Notable for the following components:   Hemoglobin 11.0 (*)    HCT 36.2 (*)    MCV 77.8 (*)    MCH 23.7 (*)    Lymphs Abs 0.6 (*)    All other components within normal limits  INFLUENZA PANEL BY PCR (TYPE A & B)  TROPONIN I     ____________________________________________    RADIOLOGY All xrays were viewed by me. Imaging interpreted by radiologist.  I, Lisa Roca MD have personally reviewed the imaging report noted below.  CXR:  IMPRESSION: 1. Enlarging right paratracheal opacity concerning for progression of known metastatic disease to the mediastinum. 2. Small left base opacity which may reflect early pneumonia.   CT chest for PE:  IMPRESSION: 1. No evidence of acute pulmonary embolism or other acute chest process. 2. Interval resolution of right paratracheal adenopathy with new right perihilar pulmonary opacity compared with 2018 studies. 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 No definite signs of local recurrence. If clinical concern of local recurrence, consider PET-CT or continued CT follow-up. 3. No suspicious pulmonary nodules.  ____________________________________________   PROCEDURES  Procedure(s) performed: None  Procedures  Critical Care performed: None  ____________________________________________   INITIAL IMPRESSION / ASSESSMENT AND PLAN / ED COURSE   Pertinent labs & imaging results that were available during my care of the patient were reviewed by me and considered  in my medical decision making (see chart for details).   I accepted care of this morning and went to check with the patient.  He overall looks well appearing.  He is having no hypoxia or wheezing.  Chest x-ray with some nonspecific findings, question early pneumonia, question new mass, discussed with the patient just proceed with CT scanning to further elucidate.  Clinically he does not have a fever or hypoxia or clinical lung findings concerning for pneumonia on exam.  CT scan does not indicate pneumonia or PE or any other acute lung findings.  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  In any case, patient is overall well-appearing and I think he is reasonable for discharge home with a presumptive diagnosis of viral URI.  We discussed his flu test is negative.  His labs are reassuring.  We discussed close follow-up given his underlying chronic medical problems.    CONSULTATIONS: None   Patient / Family / Caregiver informed of clinical course, medical decision-making process, and agree with plan.   I discussed return precautions, follow-up instructions, and discharged instructions with patient and/or family.   Discharge instructions:  Your evaluated for cough and although no certain cause was found, your exam evaluation overall reassuring in the emergency department today.  As we discussed, this may be due to a virus.  Make sure that you are getting plenty of sleep and drinking plenty fluids.  Return to the emerge department immediately for any worsening condition including trouble breathing, shortness of breath, chest pain, dizziness or passing out, fever, or any other symptoms concerning to you.   ____________________________________________   FINAL CLINICAL IMPRESSION(S) / ED DIAGNOSES  Final diagnoses:  Cough  Upper respiratory tract infection, unspecified type        Lisa Roca, MD 04/12/18 [phone]

## 2018-04-12 NOTE — ED Notes (Signed)
Patient transported to X-ray 

## 2018-05-06 ENCOUNTER — Ambulatory Visit
Admission: RE | Admit: 2018-05-06 | Payer: BLUE CROSS/BLUE SHIELD | Source: Home / Self Care | Admitting: Gastroenterology

## 2018-05-08 ENCOUNTER — Ambulatory Visit
Admission: RE | Admit: 2018-05-08 | Payer: BLUE CROSS/BLUE SHIELD | Source: Ambulatory Visit | Admitting: Radiation Oncology

## 2018-05-15 ENCOUNTER — Encounter: Payer: Self-pay | Admitting: *Deleted

## 2018-05-16 ENCOUNTER — Ambulatory Visit
Admission: RE | Admit: 2018-05-16 | Discharge: 2018-05-16 | Disposition: A | Discharge status: Home / Self Care | Payer: BLUE CROSS/BLUE SHIELD | Attending: Gastroenterology | Admitting: Gastroenterology

## 2018-05-16 ENCOUNTER — Encounter: Admission: RE | Payer: Self-pay | Source: Home / Self Care

## 2018-05-16 ENCOUNTER — Encounter
Admission: RE | Disposition: A | Discharge status: Home / Self Care | Payer: Self-pay | Source: Home / Self Care | Attending: Gastroenterology

## 2018-05-16 ENCOUNTER — Ambulatory Visit: Payer: BLUE CROSS/BLUE SHIELD | Admitting: Anesthesiology

## 2018-05-16 ENCOUNTER — Encounter: Payer: Self-pay | Admitting: Gastroenterology

## 2018-05-16 DIAGNOSIS — E119 Type 2 diabetes mellitus without complications: Secondary | ICD-10-CM | POA: Insufficient documentation

## 2018-05-16 DIAGNOSIS — Z7984 Long term (current) use of oral hypoglycemic drugs: Secondary | ICD-10-CM | POA: Insufficient documentation

## 2018-05-16 DIAGNOSIS — Z7982 Long term (current) use of aspirin: Secondary | ICD-10-CM | POA: Diagnosis not present

## 2018-05-16 DIAGNOSIS — K219 Gastro-esophageal reflux disease without esophagitis: Secondary | ICD-10-CM | POA: Diagnosis not present

## 2018-05-16 DIAGNOSIS — Z87891 Personal history of nicotine dependence: Secondary | ICD-10-CM | POA: Diagnosis not present

## 2018-05-16 DIAGNOSIS — B1920 Unspecified viral hepatitis C without hepatic coma: Secondary | ICD-10-CM | POA: Diagnosis not present

## 2018-05-16 DIAGNOSIS — Z8546 Personal history of malignant neoplasm of prostate: Secondary | ICD-10-CM | POA: Insufficient documentation

## 2018-05-16 DIAGNOSIS — E785 Hyperlipidemia, unspecified: Secondary | ICD-10-CM | POA: Diagnosis not present

## 2018-05-16 DIAGNOSIS — K7460 Unspecified cirrhosis of liver: Secondary | ICD-10-CM | POA: Diagnosis not present

## 2018-05-16 DIAGNOSIS — Z86010 Personal history of colonic polyps: Secondary | ICD-10-CM | POA: Insufficient documentation

## 2018-05-16 HISTORY — DX: Unspecified cirrhosis of liver: K74.60

## 2018-05-16 HISTORY — DX: Liver cell carcinoma: C22.0

## 2018-05-16 HISTORY — PX: COLONOSCOPY WITH PROPOFOL: SHX5780

## 2018-05-16 HISTORY — DX: Gastritis, unspecified, without bleeding: K29.70

## 2018-05-16 HISTORY — DX: Hyperlipidemia, unspecified: E78.5

## 2018-05-16 LAB — CBC WITH DIFFERENTIAL/PLATELET
Abs Immature Granulocytes: 0.01 10*3/uL (ref 0.00–0.07)
BASOS ABS: 0 10*3/uL (ref 0.0–0.1)
Basophils Relative: 0 %
EOS PCT: 6 %
Eosinophils Absolute: 0.2 10*3/uL (ref 0.0–0.5)
HEMATOCRIT: 32.4 % — AB (ref 39.0–52.0)
HEMOGLOBIN: 9.7 g/dL — AB (ref 13.0–17.0)
Immature Granulocytes: 0 %
LYMPHS PCT: 28 %
Lymphs Abs: 0.8 10*3/uL (ref 0.7–4.0)
MCH: 23.7 pg — ABNORMAL LOW (ref 26.0–34.0)
MCHC: 29.9 g/dL — ABNORMAL LOW (ref 30.0–36.0)
MCV: 79.2 fL — AB (ref 80.0–100.0)
Monocytes Absolute: 0.3 10*3/uL (ref 0.1–1.0)
Monocytes Relative: 11 %
Neutro Abs: 1.6 10*3/uL — ABNORMAL LOW (ref 1.7–7.7)
Neutrophils Relative %: 55 %
PLATELETS: 178 10*3/uL (ref 150–400)
RBC: 4.09 MIL/uL — ABNORMAL LOW (ref 4.22–5.81)
RDW: 15.7 % — ABNORMAL HIGH (ref 11.5–15.5)
WBC: 2.9 10*3/uL — ABNORMAL LOW (ref 4.0–10.5)
nRBC: 0 % (ref 0.0–0.2)

## 2018-05-16 LAB — GLUCOSE, CAPILLARY: Glucose-Capillary: 78 mg/dL (ref 70–99)

## 2018-05-16 SURGERY — COLONOSCOPY WITH PROPOFOL
Anesthesia: General

## 2018-05-16 MED ORDER — PROPOFOL 10 MG/ML IV BOLUS
INTRAVENOUS | Status: DC | PRN
  Administered 2018-05-16 (×2): 50 mg via INTRAVENOUS

## 2018-05-16 MED ORDER — LIDOCAINE HCL (PF) 1 % IJ SOLN
2.0000 mL | Freq: Once | INTRAMUSCULAR | Status: AC
  Administered 2018-05-16: 0.3 mL via INTRADERMAL

## 2018-05-16 MED ORDER — PROPOFOL 10 MG/ML IV BOLUS
INTRAVENOUS | Status: AC
  Filled 2018-05-16: qty 80

## 2018-05-16 MED ORDER — PROPOFOL 500 MG/50ML IV EMUL
INTRAVENOUS | Status: DC | PRN
  Administered 2018-05-16: 100 ug/kg/min via INTRAVENOUS

## 2018-05-16 MED ORDER — SODIUM CHLORIDE 0.9 % IV SOLN
INTRAVENOUS | Status: DC
  Administered 2018-05-16: 09:00:00 via INTRAVENOUS
  Administered 2018-05-16: 1000 mL via INTRAVENOUS

## 2018-05-16 MED ORDER — LIDOCAINE HCL (PF) 1 % IJ SOLN
INTRAMUSCULAR | Status: AC
  Administered 2018-05-16: 0.3 mL via INTRADERMAL
  Filled 2018-05-16: qty 2

## 2018-05-16 MED ORDER — EPHEDRINE SULFATE 50 MG/ML IJ SOLN
INTRAMUSCULAR | Status: DC | PRN
  Administered 2018-05-16 (×2): 5 mg via INTRAVENOUS

## 2018-05-16 MED ORDER — LIDOCAINE HCL (CARDIAC) PF 100 MG/5ML IV SOSY
PREFILLED_SYRINGE | INTRAVENOUS | Status: DC | PRN
  Administered 2018-05-16: 100 mg via INTRAVENOUS

## 2018-05-16 NOTE — Transfer of Care (Signed)
Immediate Anesthesia Transfer of Care Note  Patient: Rodney Moody  Procedure(s) Performed: COLONOSCOPY WITH PROPOFOL (N/A )  Patient Location: PACU and Endoscopy Unit  Anesthesia Type:General  Level of Consciousness: sedated and patient cooperative  Airway & Oxygen Therapy: Patient Spontanous Breathing and Patient connected to nasal cannula oxygen  Post-op Assessment: Report given to RN, Post -op Vital signs reviewed and stable and Patient moving all extremities  Post vital signs: Reviewed and stable  Last Vitals:  Vitals Value Taken Time  BP 119/52 05/16/2018  9:10 AM  Temp 36.1 C 05/16/2018  9:10 AM  Pulse 63 05/16/2018  9:10 AM  Resp 13 05/16/2018  9:10 AM  SpO2 100 % 05/16/2018  9:10 AM  Vitals shown include unvalidated device data.  Last Pain:  Vitals:   05/16/18 0910  TempSrc: Tympanic  PainSc: Asleep         Complications: No apparent anesthesia complications

## 2018-05-16 NOTE — Anesthesia Preprocedure Evaluation (Signed)
Anesthesia Evaluation  Patient identified by MRN, date of birth, ID band Patient awake    Reviewed: Allergy & Precautions, NPO status , Patient's Chart, lab work & pertinent test results  History of Anesthesia Complications Negative for: history of anesthetic complications  Airway Mallampati: II  TM Distance: >3 FB Neck ROM: Full    Dental  (+) Partial Lower, Partial Upper   Pulmonary neg sleep apnea, neg COPD, former smoker,    breath sounds clear to auscultation- rhonchi (-) wheezing      Cardiovascular Exercise Tolerance: Good (-) hypertension(-) CAD, (-) Past MI, (-) Cardiac Stents and (-) CABG  Rhythm:Regular Rate:Normal - Systolic murmurs and - Diastolic murmurs    Neuro/Psych neg Seizures negative neurological ROS  negative psych ROS   GI/Hepatic GERD  ,(+) Hepatitis -, C  Endo/Other  diabetes  Renal/GU negative Renal ROS     Musculoskeletal negative musculoskeletal ROS (+)   Abdominal (+) - obese,   Peds  Hematology negative hematology ROS (+)   Anesthesia Other Findings Past Medical History: No date: Cirrhosis of liver (HCC) No date: Diabetes mellitus without complication (HCC)     Comment:  borderline Diabetes, but no medication No date: Gastritis No date: GERD (gastroesophageal reflux disease) No date: Hepatitis B No date: Hepatitis C     Comment:  with cirrhosis 10/06/2015: Hepatocellular carcinoma (HCC) No date: Hyperlipidemia No date: Prostate cancer ([place])   Reproductive/Obstetrics                             Anesthesia Physical Anesthesia Plan  ASA: II  Anesthesia Plan: General   Post-op Pain Management:    Induction: Intravenous  PONV Risk Score and Plan: 1 and Propofol infusion  Airway Management Planned: Natural Airway  Additional Equipment:   Intra-op Plan:   Post-operative Plan:   Informed Consent: I have reviewed the patients History and  Physical, chart, labs and discussed the procedure including the risks, benefits and alternatives for the proposed anesthesia with the patient or authorized representative who has indicated his/her understanding and acceptance.     Dental advisory given  Plan Discussed with: CRNA and Anesthesiologist  Anesthesia Plan Comments:         Anesthesia Quick Evaluation

## 2018-05-16 NOTE — Anesthesia Post-op Follow-up Note (Signed)
Anesthesia QCDR form completed.        

## 2018-05-16 NOTE — Anesthesia Postprocedure Evaluation (Signed)
Anesthesia Post Note  Patient: Rodney Moody  Procedure(s) Performed: COLONOSCOPY WITH PROPOFOL (N/A )  Patient location during evaluation: Endoscopy Anesthesia Type: General Level of consciousness: awake and alert and oriented Pain management: pain level controlled Vital Signs Assessment: post-procedure vital signs reviewed and stable Respiratory status: spontaneous breathing, nonlabored ventilation and respiratory function stable Cardiovascular status: blood pressure returned to baseline and stable Postop Assessment: no signs of nausea or vomiting Anesthetic complications: no     Last Vitals:  Vitals:   05/16/18 0920 05/16/18 0930  BP: (!) 124/58 124/67  Pulse: 79 64  Resp: (!) 21 15  Temp:    SpO2: 100% 100%    Last Pain:  Vitals:   05/16/18 0930  TempSrc:   PainSc: 0-No pain                 Ayiden Milliman

## 2018-05-16 NOTE — Op Note (Addendum)
[facility] Gastroenterology Patient Name: Rodney Moody Procedure Date: 05/16/2018 8:22 AM MRN: [id number] Account #: [id number] Date of Birth: 11-05-1945 Admit Type: Outpatient Age: 73 Room: [facility] ENDO ROOM 1 Gender: Male Note Status: Finalized Procedure:            Colonoscopy Indications:          Personal history of colonic polyps Providers:            Lollie Sails, MD Referring MD:         [facility] Medicines:            Monitored Anesthesia Care Complications:        No immediate complications. Procedure:            Pre-Anesthesia Assessment:                       - ASA Grade Assessment: III - A patient with severe                        systemic disease.                       After obtaining informed consent, the colonoscope was                        passed under direct vision. Throughout the procedure,                        the patient's blood pressure, pulse, and oxygen                        saturations were monitored continuously. The                        Colonoscope was introduced through the anus and                        advanced to the the cecum, identified by appendiceal                        orifice and ileocecal valve. The colonoscopy was                        performed without difficulty. The patient tolerated the                        procedure well. The quality of the bowel preparation                        was good. Findings:      The colon (entire examined portion) appeared normal.      The retroflexed view of the distal rectum and anal verge was normal and       showed no anal or rectal abnormalities.      The digital rectal exam was normal. Impression:           - The entire examined colon is normal.                       - The distal rectum and anal verge are normal on  retroflexion view.                       - No specimens collected. Recommendation:       - Discharge patient to  home. Procedure Code(s):    --- Professional ---                       [phone], Colonoscopy, flexible; diagnostic, including                        collection of specimen(s) by brushing or washing, when                        performed (separate procedure) Diagnosis Code(s):    --- Professional ---                       Z86.010, Personal history of colonic polyps CPT copyright 2018 American Medical Association. All rights reserved. The codes documented in this report are preliminary and upon coder review may  be revised to meet current compliance requirements. Lollie Sails, MD 05/16/2018 9:08:14 AM This report has been signed electronically. Number of Addenda: 0 Note Initiated On: 05/16/2018 8:22 AM Scope Withdrawal Time: 0 hours 7 minutes 47 seconds  Total Procedure Duration: 0 hours 17 minutes 55 seconds       [facility]

## 2018-05-16 NOTE — H&P (Signed)
I have discussed the risks benefits and complications of procedures to include not limited to bleeding, infection, perforation and the risk of sedation and the patient wishes to proceed.Outpatient short stay form Pre-procedure 05/16/2018 8:34 AM Lollie Sails MD  Primary Physician: Dr. Keith Rake  Reason for visit: Colonoscopy  History of present illness: Patient is a 73 year old male presenting today for colonoscopy.  He has personal history of adenomatous colon polyps with his last colonoscopy being 06/04/2016, however at that time the prep was quite poor and we were unable to proceed.  States his prep is much better today. Patient also has a history of hepatocellular carcinoma for which he underwent a liver lobectomy and he is now currently being treated for probable metastatic lesions to his left lung.  Last treatment was about a week ago.  Checked his hemogram today this showing white count of 2.9 with a neutrophil count of 1.6 and platelets of 178.  His hemoglobin is 9.7.  A review of his most recent time shows this of 1.1 on 05/13/2018.  This is been very stable for quite some time.  He tolerated his prep well.  He does take 81 mg aspirin but no other blood thinning agent.  Other aspirin product.   Current Facility-Administered Medications:  .  lidocaine (PF) (XYLOCAINE) 1 % injection, , , ,  .  0.9 %  sodium chloride infusion, , Intravenous, Continuous, ,  U, MD .  lidocaine (PF) (XYLOCAINE) 1 % injection 2 mL, 2 mL, Intradermal, Once, Lollie Sails, MD  Medications Prior to Admission  Medication Sig Dispense Refill Last Dose  . aspirin 81 MG tablet Take 81 mg by mouth daily.   05/15/2018  . loperamide (IMODIUM) 2 MG capsule Take by mouth.   Taking  . metFORMIN (GLUCOPHAGE) 500 MG tablet Take 1 tablet (500 mg total) by mouth 2 (two) times daily with a meal. 180 tablet 1 Taking  . pantoprazole (PROTONIX) 40 MG tablet TAKE 1 TABLET BY MOUTH EVERY DAY 90 tablet 1       Allergies  Allergen Reactions  . Lisinopril Cough     Past Medical History:  Diagnosis Date  . Cirrhosis of liver ([place])   . Diabetes mellitus without complication ([place])    borderline Diabetes, but no medication  . Gastritis   . GERD (gastroesophageal reflux disease)   . Hepatitis B   . Hepatitis C    with cirrhosis  . Hepatocellular carcinoma ([place]) 10/06/2015  . Hyperlipidemia   . Prostate cancer [facility])     Review of systems:      Physical Exam    Heart and lungs: Regular rate and rhythm without rub or gallop, lungs are bilaterally clear.    HEENT: Normocephalic atraumatic eyes are anicteric    Other:    Pertinant exam for procedure: Soft nontender nondistended bowel sounds positive normoactive.    Planned proceedures: Colonoscopy and indicated procedures.    Lollie Sails, MD Gastroenterology 05/16/2018  8:34 AM

## 2018-07-09 ENCOUNTER — Ambulatory Visit: Payer: BLUE CROSS/BLUE SHIELD | Admitting: Radiation Oncology

## 2018-07-10 ENCOUNTER — Institutional Professional Consult (permissible substitution): Payer: BLUE CROSS/BLUE SHIELD | Admitting: Radiation Oncology

## 2018-07-10 ENCOUNTER — Ambulatory Visit
Admission: RE | Admit: 2018-07-10 | Discharge: 2018-07-10 | Disposition: A | Discharge status: Home / Self Care | Payer: Self-pay | Source: Ambulatory Visit | Attending: Radiation Oncology | Admitting: Radiation Oncology

## 2018-07-10 ENCOUNTER — Other Ambulatory Visit: Payer: Self-pay | Admitting: Radiation Oncology

## 2018-07-10 DIAGNOSIS — C799 Secondary malignant neoplasm of unspecified site: Secondary | ICD-10-CM

## 2018-07-11 ENCOUNTER — Other Ambulatory Visit: Payer: Self-pay

## 2018-07-11 ENCOUNTER — Ambulatory Visit: Payer: BLUE CROSS/BLUE SHIELD | Admitting: Family Medicine

## 2018-07-11 ENCOUNTER — Encounter: Payer: Self-pay | Admitting: Radiation Oncology

## 2018-07-11 ENCOUNTER — Other Ambulatory Visit: Payer: Self-pay | Admitting: *Deleted

## 2018-07-11 ENCOUNTER — Ambulatory Visit
Admission: RE | Admit: 2018-07-11 | Discharge: 2018-07-11 | Disposition: A | Discharge status: Home / Self Care | Payer: BLUE CROSS/BLUE SHIELD | Source: Ambulatory Visit | Attending: Radiation Oncology | Admitting: Radiation Oncology

## 2018-07-11 VITALS — BP 144/78 | HR 72 | Temp 97.3°F | Resp 14 | Wt 157.3 lb

## 2018-07-11 DIAGNOSIS — Z923 Personal history of irradiation: Secondary | ICD-10-CM | POA: Insufficient documentation

## 2018-07-11 DIAGNOSIS — C7801 Secondary malignant neoplasm of right lung: Secondary | ICD-10-CM | POA: Diagnosis present

## 2018-07-11 DIAGNOSIS — R042 Hemoptysis: Secondary | ICD-10-CM | POA: Insufficient documentation

## 2018-07-11 DIAGNOSIS — C228 Malignant neoplasm of liver, primary, unspecified as to type: Secondary | ICD-10-CM

## 2018-07-11 DIAGNOSIS — C220 Liver cell carcinoma: Secondary | ICD-10-CM | POA: Diagnosis not present

## 2018-07-11 DIAGNOSIS — R918 Other nonspecific abnormal finding of lung field: Secondary | ICD-10-CM

## 2018-07-11 NOTE — Progress Notes (Signed)
Radiation Oncology Follow up Note Old patient new area right hilar recurrence  Name: Rodney Moody   Date:   07/11/2018 MRN:  [id number] DOB: [birth year]    This 73 y.o. male presents to the clinic today for reevaluation of metastatic disease to his right hilum for stage IV hepatocellular carcinoma.  REFERRING PROVIDER: [facility]  HPI: patient is a 73 year old male well known to our department having been treated back in 2018 to his right hilum for metastatic hepatocellular carcinoma..patient's had her eft hepatic mastectomy with no evidence of recurrent hepatocellular carcinoma. He has some increased density in the right hilar region which is concerning for recurrent hepatocellular metastatic disease in this region. Patient is fairly asymptomatic. He had a bronchoscopy at [facility] with cells suspicious for malignancy. He has no cough. He has some occasional episodes of trace hemoptysis.  COMPLICATIONS OF TREATMENT: none  FOLLOW UP COMPLIANCE: keeps appointments   PHYSICAL EXAM:  BP (!) 144/78 (BP Location: Left Arm, Patient Position: Sitting)   Pulse 72   Temp (!) 97.3 F (36.3 C) (Tympanic)   Resp 14   Wt 157 lb 4.8 oz (71.3 kg)   BMI 23.92 kg/m  Well-developed well-nourished patient in NAD. HEENT reveals PERLA, EOMI, discs not visualized.  Oral cavity is clear. No oral mucosal lesions are identified. Neck is clear without evidence of cervical or supraclavicular adenopathy. Lungs are clear to A&P. Cardiac examination is essentially unremarkable with regular rate and rhythm without murmur rub or thrill. Abdomen is benign with no organomegaly or masses noted. Motor sensory and DTR levels are equal and symmetric in the upper and lower extremities. Cranial nerves II through XII are grossly intact. Proprioception is intact. No peripheral adenopathy or edema is identified. No motor or sensory levels are noted. Crude visual fields are within normal range.  RADIOLOGY RESULTS: CT scans  are reviewed and compared to prior studies.  PLAN: at this time of ordered a PET CT scan to better delineate possibility of recurrent disease in his right hilum. Based on his cytology and progressive increased density in the right hilar region may offer salvage radiation therapy after review of his PET/CT scan. All of this was explained to the patient in detail. I've set him up for PET/CT with a follow-up appointment shortly thereafter to discuss results. Patient is comfortable with my decision making and plan at this time.  I would like to take this opportunity to thank you for allowing me to participate in the care of your patient.Noreene Filbert, MD

## 2018-07-18 ENCOUNTER — Ambulatory Visit: Payer: BLUE CROSS/BLUE SHIELD

## 2018-07-25 ENCOUNTER — Encounter: Admission: RE | Admit: 2018-07-25 | Payer: BLUE CROSS/BLUE SHIELD | Source: Ambulatory Visit

## 2018-07-25 ENCOUNTER — Ambulatory Visit: Payer: BLUE CROSS/BLUE SHIELD | Admitting: Radiation Oncology

## 2018-07-29 ENCOUNTER — Ambulatory Visit
Admission: RE | Admit: 2018-07-29 | Discharge: 2018-07-29 | Disposition: A | Discharge status: Home / Self Care | Payer: BLUE CROSS/BLUE SHIELD | Source: Ambulatory Visit | Attending: Radiation Oncology | Admitting: Radiation Oncology

## 2018-07-29 ENCOUNTER — Other Ambulatory Visit: Payer: Self-pay

## 2018-07-29 ENCOUNTER — Other Ambulatory Visit: Payer: Self-pay | Admitting: Radiation Oncology

## 2018-07-29 DIAGNOSIS — R918 Other nonspecific abnormal finding of lung field: Secondary | ICD-10-CM

## 2018-07-29 LAB — GLUCOSE, CAPILLARY: GLUCOSE-CAPILLARY: 64 mg/dL — AB (ref 70–99)

## 2018-07-29 MED ORDER — FLUDEOXYGLUCOSE F - 18 (FDG) INJECTION
8.5000 | Freq: Once | INTRAVENOUS | Status: AC | PRN
  Administered 2018-07-29: 8.5 via INTRAVENOUS

## 2018-07-31 ENCOUNTER — Other Ambulatory Visit: Payer: Self-pay | Admitting: Family Medicine

## 2018-07-31 DIAGNOSIS — K219 Gastro-esophageal reflux disease without esophagitis: Secondary | ICD-10-CM

## 2018-07-31 NOTE — Telephone Encounter (Signed)
LVM for pt to call the office to schedule an appt. °

## 2018-08-01 ENCOUNTER — Other Ambulatory Visit: Payer: Self-pay

## 2018-08-01 ENCOUNTER — Encounter: Payer: Self-pay | Admitting: Radiation Oncology

## 2018-08-01 ENCOUNTER — Ambulatory Visit
Admission: RE | Admit: 2018-08-01 | Discharge: 2018-08-01 | Disposition: A | Discharge status: Home / Self Care | Payer: BLUE CROSS/BLUE SHIELD | Source: Ambulatory Visit | Attending: Radiation Oncology | Admitting: Radiation Oncology

## 2018-08-01 ENCOUNTER — Other Ambulatory Visit: Payer: Self-pay | Admitting: *Deleted

## 2018-08-01 VITALS — BP 147/76 | Temp 98.3°F | Wt 160.5 lb

## 2018-08-01 DIAGNOSIS — C228 Malignant neoplasm of liver, primary, unspecified as to type: Secondary | ICD-10-CM

## 2018-08-01 DIAGNOSIS — Z923 Personal history of irradiation: Secondary | ICD-10-CM | POA: Insufficient documentation

## 2018-08-01 DIAGNOSIS — C220 Liver cell carcinoma: Secondary | ICD-10-CM | POA: Insufficient documentation

## 2018-08-01 DIAGNOSIS — C7801 Secondary malignant neoplasm of right lung: Secondary | ICD-10-CM | POA: Diagnosis not present

## 2018-08-01 NOTE — Progress Notes (Signed)
Radiation Oncology Follow up Note  Name: Rodney Moody   Date:   08/01/2018 MRN:  [id number] DOB: 02-Mar-1946    This 73 y.o. male presents to the clinic today for reevaluation status post PET CT scan for stage IV hepatocellular carcinoma.  REFERRING PROVIDER: [facility]  HPI: Patient is a 73 year old male treated back in 2018 to his right hilum for metastatic hepatocellular carcinoma.  Patient otherwise has been doing well.  He did have some increased density on the CT scan underwent bronchoscopy at [facility] which showed some cells suspicious for malignancy.  I ran a PET CT scan showing.  Low metabolic activity in the right hilum consistent with benign radiation fibrosis.  No other evidence of disease was noted.  COMPLICATIONS OF TREATMENT: none  FOLLOW UP COMPLIANCE: keeps appointments   PHYSICAL EXAM:  BP (!) 147/76   Temp 98.3 F (36.8 C) (Tympanic)   Wt 160 lb 7.9 oz (72.8 kg)   BMI 24.40 kg/m  Well-developed well-nourished patient in NAD. HEENT reveals PERLA, EOMI, discs not visualized.  Oral cavity is clear. No oral mucosal lesions are identified. Neck is clear without evidence of cervical or supraclavicular adenopathy. Lungs are clear to A&P. Cardiac examination is essentially unremarkable with regular rate and rhythm without murmur rub or thrill. Abdomen is benign with no organomegaly or masses noted. Motor sensory and DTR levels are equal and symmetric in the upper and lower extremities. Cranial nerves II through XII are grossly intact. Proprioception is intact. No peripheral adenopathy or edema is identified. No motor or sensory levels are noted. Crude visual fields are within normal range.  RADIOLOGY RESULTS: PET CT scan reviewed and compatible with above-stated findings  PLAN: Present time I am inclined to continue to observe the patient have asked to see him back in about 4 months for follow-up and obtain a CT scan of the chest at that time.  Should to be no  significant progression at that time we will just continue to follow patient on a yearly basis.  Patient is comfortable my decisions.  Patient knows to call with any concerns.  I would like to take this opportunity to thank you for allowing me to participate in the care of your patient.Noreene Filbert, MD

## 2018-10-17 ENCOUNTER — Other Ambulatory Visit: Payer: Self-pay

## 2018-10-17 ENCOUNTER — Encounter: Payer: Self-pay | Admitting: Family Medicine

## 2018-10-17 ENCOUNTER — Ambulatory Visit (INDEPENDENT_AMBULATORY_CARE_PROVIDER_SITE_OTHER): Payer: BC Managed Care – PPO | Admitting: Family Medicine

## 2018-10-17 VITALS — BP 130/60 | HR 70 | Temp 97.9°F | Resp 16 | Ht 68.0 in | Wt 158.6 lb

## 2018-10-17 DIAGNOSIS — E0590 Thyrotoxicosis, unspecified without thyrotoxic crisis or storm: Secondary | ICD-10-CM | POA: Diagnosis not present

## 2018-10-17 DIAGNOSIS — B182 Chronic viral hepatitis C: Secondary | ICD-10-CM

## 2018-10-17 DIAGNOSIS — C220 Liver cell carcinoma: Secondary | ICD-10-CM | POA: Diagnosis not present

## 2018-10-17 DIAGNOSIS — E785 Hyperlipidemia, unspecified: Secondary | ICD-10-CM

## 2018-10-17 DIAGNOSIS — E1169 Type 2 diabetes mellitus with other specified complication: Secondary | ICD-10-CM

## 2018-10-17 DIAGNOSIS — E441 Mild protein-calorie malnutrition: Secondary | ICD-10-CM | POA: Insufficient documentation

## 2018-10-17 DIAGNOSIS — Z0000 Encounter for general adult medical examination without abnormal findings: Secondary | ICD-10-CM

## 2018-10-17 DIAGNOSIS — K219 Gastro-esophageal reflux disease without esophagitis: Secondary | ICD-10-CM

## 2018-10-17 DIAGNOSIS — D649 Anemia, unspecified: Secondary | ICD-10-CM

## 2018-10-17 DIAGNOSIS — Z23 Encounter for immunization: Secondary | ICD-10-CM

## 2018-10-17 DIAGNOSIS — I700 Atherosclerosis of aorta: Secondary | ICD-10-CM

## 2018-10-17 DIAGNOSIS — J418 Mixed simple and mucopurulent chronic bronchitis: Secondary | ICD-10-CM | POA: Insufficient documentation

## 2018-10-17 MED ORDER — METFORMIN HCL 500 MG PO TABS: 500.0000 mg | ORAL_TABLET | Freq: Two times a day (BID) | ORAL | 1 refills | Status: DC

## 2018-10-17 MED ORDER — PANTOPRAZOLE SODIUM 40 MG PO TBEC: 40.0000 mg | DELAYED_RELEASE_TABLET | Freq: Every day | ORAL | 1 refills | Status: DC

## 2018-10-17 NOTE — Patient Instructions (Signed)
Preventive Care 2 Years and Older, Male Preventive care refers to lifestyle choices and visits with your health care provider that can promote health and wellness. What does preventive care include?   A yearly physical exam. This is also called an annual well check.  Dental exams once or twice a year.  Routine eye exams. Ask your health care provider how often you should have your eyes checked.  Personal lifestyle choices, including: ? Daily care of your teeth and gums. ? Regular physical activity. ? Eating a healthy diet. ? Avoiding tobacco and drug use. ? Limiting alcohol use. ? Practicing safe sex. ? Taking low doses of aspirin every day. ? Taking vitamin and mineral supplements as recommended by your health care provider. What happens during an annual well check? The services and screenings done by your health care provider during your annual well check will depend on your age, overall health, lifestyle risk factors, and family history of disease. Counseling Your health care provider may ask you questions about your:  Alcohol use.  Tobacco use.  Drug use.  Emotional well-being.  Home and relationship well-being.  Sexual activity.  Eating habits.  History of falls.  Memory and ability to understand (cognition).  Work and work Statistician. Screening You may have the following tests or measurements:  Height, weight, and BMI.  Blood pressure.  Lipid and cholesterol levels. These may be checked every 5 years, or more frequently if you are over 9 years old.  Skin check.  Lung cancer screening. You may have this screening every year starting at age 57 if you have a 30-pack-year history of smoking and currently smoke or have quit within the past 15 years.  Colorectal cancer screening. All adults should have this screening starting at age 90 and continuing until age 69. You will have tests every 1-10 years, depending on your results and the type of screening  test. People at increased risk should start screening at an earlier age. Screening tests may include: ? Guaiac-based fecal occult blood testing. ? Fecal immunochemical test (FIT). ? Stool DNA test. ? Virtual colonoscopy. ? Sigmoidoscopy. During this test, a flexible tube with a tiny camera (sigmoidoscope) is used to examine your rectum and lower colon. The sigmoidoscope is inserted through your anus into your rectum and lower colon. ? Colonoscopy. During this test, a long, thin, flexible tube with a tiny camera (colonoscope) is used to examine your entire colon and rectum.  Prostate cancer screening. Recommendations will vary depending on your family history and other risks.  Hepatitis C blood test.  Hepatitis B blood test.  Sexually transmitted disease (STD) testing.  Diabetes screening. This is done by checking your blood sugar (glucose) after you have not eaten for a while (fasting). You may have this done every 1-3 years.  Abdominal aortic aneurysm (AAA) screening. You may need this if you are a current or former smoker.  Osteoporosis. You may be screened starting at age 30 if you are at high risk. Talk with your health care provider about your test results, treatment options, and if necessary, the need for more tests. Vaccines Your health care provider may recommend certain vaccines, such as:  Influenza vaccine. This is recommended every year.  Tetanus, diphtheria, and acellular pertussis (Tdap, Td) vaccine. You may need a Td booster every 10 years.  Varicella vaccine. You may need this if you have not been vaccinated.  Zoster vaccine. You may need this after age 42.  Measles, mumps, and rubella (MMR) vaccine.  You may need at least one dose of MMR if you were born in 1957 or later. You may also need a second dose.  Pneumococcal 13-valent conjugate (PCV13) vaccine. One dose is recommended after age 65.  Pneumococcal polysaccharide (PPSV23) vaccine. One dose is recommended  after age 65.  Meningococcal vaccine. You may need this if you have certain conditions.  Hepatitis A vaccine. You may need this if you have certain conditions or if you travel or work in places where you may be exposed to hepatitis A.  Hepatitis B vaccine. You may need this if you have certain conditions or if you travel or work in places where you may be exposed to hepatitis B.  Haemophilus influenzae type b (Hib) vaccine. You may need this if you have certain risk factors. Talk to your health care provider about which screenings and vaccines you need and how often you need them. This information is not intended to replace advice given to you by your health care provider. Make sure you discuss any questions you have with your health care provider. Document Released: 05/13/2015 Document Revised: 06/06/2017 Document Reviewed: 02/15/2015 Elsevier Interactive Patient Education  2019 [employer]  

## 2018-10-17 NOTE — Progress Notes (Signed)
Name: Rodney Moody   MRN: [id number]    DOB: 1945-11-11   Date:10/17/2018       Progress Note  Subjective  Chief Complaint  Chief Complaint  Patient presents with  . Annual Exam    HPI  Patient presents for annual CPE and follow up.  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 Leamon Arnt also radiation oncologist Dr. Donella Stade, he was on a trial at [facility], but last treatment was April 2020. He denies pain, jaundice or tremors, no confusion, urine is normal color, denies acholic stools. He states treatment affects his appetite and his initial weight was 184 lbs in 2017 before hepatic cancer was removed. He is working full time and states feeling better   Chronic bronchitis: worse in  May 2019, seen by Clyde Lundborg and also seen pulmonologist Dr. Felicie Morn. He is feeling better, back to baseline  mild cough at night and dry mouth in am. He was a heavy smoker in the past, quit smoking 1999.   DMII: hgbA1C was 7.0% July 2018, but has lost weight, 04/2017 hgbA1C 6.5%, 5.9% last visit it was  6.3% . He has a poor appetite but is trying to eat - states getting a little better. Glucose at home has been controlled in the low 100, this am was 112 . He denies polyphagia, polydipsia or polyuria.   GERD: taking medication and denies side effects he wakes up with a dry mouth, but controlled with medication   Hyperthyroidism: off medication, no palpitation, but has some diarrhea, last TSH done at [facility] 11/2017 was normal we will recheck today   Dyslipidemia: not currently on medication, has history of liver cancer and is not interested even though explained to him today that he has aorta atherosclerosis also. He has been taking aspirin 81 mg daily. Unchanged   History of prostate cancer: under the care of Dr. Eliberto Ivory   Malnutrition: multifactorial, started to lose weight when on treatment for cancer that started in 2017 . Baseline weight  around 184 lbs, and was  down to 152.9 lbs but gradually trying to gain weight and today is 158 lbs. He states appetite is slightly better and is trying to eat a good breakfast   USPSTF grade A and B recommendations:  Diet: good breakfast, he packs his lunch but states does not have a good appetite at night Exercise: only at work  Depression: phq 9 is negative Depression screen [facility] 2/9 10/17/2018 01/03/2018 09/18/2017 08/30/2017 06/13/2017  Decreased Interest 0 0 0 0 0  Down, Depressed, Hopeless 0 0 0 0 0  PHQ - 2 Score 0 0 0 0 0  Altered sleeping 0 2 - - -  Tired, decreased energy 0 0 - - -  Change in appetite 0 0 - - -  Feeling bad or failure about yourself  0 0 - - -  Trouble concentrating 0 0 - - -  Moving slowly or fidgety/restless 0 0 - - -  Suicidal thoughts 0 0 - - -  PHQ-9 Score 0 2 - - -  Difficult doing work/chores - Not difficult at all - - -    Hypertension:  BP Readings from Last 3 Encounters:  10/17/18 130/60  08/01/18 (!) 147/76  07/11/18 (!) 144/78    Obesity: Wt Readings from Last 3 Encounters:  10/17/18 158 lb 9.6 oz (71.9 kg)  08/01/18 160 lb 7.9 oz (72.8 kg)  07/11/18 157 lb 4.8 oz (  71.3 kg)   BMI Readings from Last 3 Encounters:  10/17/18 24.12 kg/m  08/01/18 24.40 kg/m  07/11/18 23.92 kg/m     Lipids:  Lab Results  Component Value Date   CHOL 172 11/08/2016   CHOL 221 (H) 02/06/2016   CHOL 206 (H) 03/04/2015   Lab Results  Component Value Date   HDL 82 11/08/2016   HDL 99 02/06/2016   HDL 90 03/04/2015   Lab Results  Component Value Date   LDLCALC 76 11/08/2016   LDLCALC 106 02/06/2016   LDLCALC 104 (H) 03/04/2015   Lab Results  Component Value Date   TRIG 71 11/08/2016   TRIG 81 02/06/2016   TRIG 62 03/04/2015   Lab Results  Component Value Date   CHOLHDL 2.1 11/08/2016   CHOLHDL 2.2 02/06/2016   CHOLHDL 2.3 03/04/2015   No results found for: LDLDIRECT Glucose:  Glucose  Date Value Ref Range Status  03/15/2014 124 (H) 65  - 99 mg/dL Final  09/13/2011 93 65 - 99 mg/dL Final   Glucose, Bld  Date Value Ref Range Status  04/12/2018 88 70 - 99 mg/dL Final  01/03/2017 355 (H) 65 - 99 mg/dL Final  11/08/2016 106 (H) 65 - 99 mg/dL Final   Glucose-Capillary  Date Value Ref Range Status  07/29/2018 64 (L) 70 - 99 mg/dL Final  05/16/2018 78 70 - 99 mg/dL Final  04/11/2017 52 (L) 65 - 99 mg/dL Final      Office Visit from 10/17/2018 in [facility]  AUDIT-C Score  0      Married STD testing and prevention (HIV/chl/gon/syphilis): N/A Hep C: NA  Skin cancer: discussed atypical lesions Colorectal cancer: up to date Prostate cancer: See Dr. Rogers Blocker, Urologist   IPSS Questionnaire (AUA-7): Over the past month.   1)  How often have you had a sensation of not emptying your bladder completely after you finish urinating?  0 - Not at all  2)  How often have you had to urinate again less than two hours after you finished urinating? 0 - Not at all  3)  How often have you found you stopped and started again several times when you urinated?  0 - Not at all  4) How difficult have you found it to postpone urination?  0 - Not at all  5) How often have you had a weak urinary stream?  0 - Not at all  6) How often have you had to push or strain to begin urination?  0 - Not at all  7) How many times did you most typically get up to urinate from the time you went to bed until the time you got up in the morning?  1 - 1 time  Total score:  0-7 mildly symptomatic   8-19 moderately symptomatic   20-35 severely symptomatic    Lung cancer:  Low Dose CT Chest recommended if Age 3-80 years, 30 pack-year currently smoking OR have quit w/in 15years. Patient does qualify.  Under the care of oncologist  AAA:   Had CT chest and MRI abdomen, aorta atherosclerosis  ECG:  2019  Advanced Care Planning: A voluntary discussion about advance care planning including the explanation and discussion of advance directives.   Discussed health care proxy and Living will, and the patient was able to identify a health care proxy as wife   Patient does not have a living will at present time.  Patient Active Problem List   Diagnosis Date  Noted  . Atherosclerosis of abdominal aorta ([place]) 01/03/2018  . 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 Vitamin D deficiency 08/30/2017  . H/O malignant neoplasm of prostate 08/30/2017  . Hyperthyroidism without crisis 12/28/2016  . Chronic viral hepatitis C ([place]) 02/07/2016  . Low back pain 12/16/2015  . Hepatocellular carcinoma ([place]) 08/16/2015  . Elevated liver enzymes 03/18/2015  . GERD (gastroesophageal reflux disease) 11/19/2014  . Dyslipidemia associated with type 2 diabetes mellitus ([place]) 11/19/2014  . Prostate cancer ([place]) 11/19/2014  . Dyslipidemia 11/19/2014  . Hyperlipidemia, unspecified 11/19/2014    Past Surgical History:  Procedure Laterality Date  . COLONOSCOPY W/ BIOPSIES  2014   Patient has polyps-benign  . COLONOSCOPY WITH PROPOFOL N/A 06/04/2016   Procedure: COLONOSCOPY WITH PROPOFOL;  Surgeon: Lollie Sails, MD;  Location: [facility] ENDOSCOPY;  Service: Endoscopy;  Laterality: N/A;  . COLONOSCOPY WITH PROPOFOL N/A 05/16/2018   Procedure: COLONOSCOPY WITH PROPOFOL;  Surgeon: Lollie Sails, MD;  Location: [facility] ENDOSCOPY;  Service: Endoscopy;  Laterality: N/A;  . ESOPHAGOGASTRODUODENOSCOPY N/A 06/04/2016   Procedure: ESOPHAGOGASTRODUODENOSCOPY (EGD);  Surgeon: Lollie Sails, MD;  Location: [facility] ENDOSCOPY;  Service: Endoscopy;  Laterality: N/A;  . laparoscopic resection of liver    . left shoulder    . PROSTATE BIOPSY      Family History  Problem Relation Age of Onset  . Diabetes Mother   . Hypertension Mother   . Heart disease Brother   . Hypertension Brother   . Hypertension Brother   . Heart attack Brother     Social History   Socioeconomic History  . Marital status: Married    Spouse name: Enid Derry  . Number of  children: 1  . Years of education: Not on file  . Highest education level: High school graduate  Occupational History  . Not on file  Social Needs  . Financial resource strain: Patient refused  . Food insecurity    Worry: Never true    Inability: Never true  . Transportation needs    Medical: No    Non-medical: No  Tobacco Use  . Smoking status: Former Smoker    Packs/day: 0.25    Years: 30.00    Pack years: 7.50    Types: Cigarettes    Quit date: 02/25/1998    Years since quitting: 20.6  . Smokeless tobacco: Never Used  Substance and Sexual Activity  . Alcohol use: No    Alcohol/week: 0.0 standard drinks    Comment: quit in 1999  was not a heavy drinker  . 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 Sexual activity: Never    Partners: Female  Lifestyle  . Physical activity    Days per week: 5 days    Minutes per session: 150+ min  . Stress: Not at all  Relationships  . Social Herbalist on phone: Twice a week    Gets together: Once a week    Attends religious service: More than 4 times per year    Active member of club or organization: Yes    Attends meetings of clubs or organizations: More than 4 times per year    Relationship status: Married  . Intimate partner violence    Fear of current or ex partner: No    Emotionally abused: No    Physically abused: No    Forced sexual activity: No  Other Topics Concern  . Not on file  Social History  Narrative   Married with 1 child          Current Outpatient Medications:  .  aspirin 81 MG tablet, Take 81 mg by mouth daily., Disp: , Rfl:  .  loperamide (IMODIUM) 2 MG capsule, Take by mouth., Disp: , Rfl:  .  metFORMIN (GLUCOPHAGE) 500 MG tablet, Take 1 tablet (500 mg total) by mouth 2 (two) times daily with a meal., Disp: 180 tablet, Rfl: 1 .  pantoprazole (PROTONIX) 40 MG tablet, TAKE 1 TABLET BY MOUTH EVERY DAY, Disp: 90 tablet, Rfl: 1  Allergies  Allergen Reactions  . Lisinopril  Cough     ROS  Constitutional: Negative for fever or weight change.  Respiratory: Negative for cough and shortness of breath.   Cardiovascular: Negative for chest pain or palpitations.  Gastrointestinal: Negative for abdominal pain, no bowel changes.  Musculoskeletal: Negative for gait problem or joint swelling.  Skin: Negative for rash.  Neurological: Negative for dizziness or headache.  No other specific complaints in a complete review of systems (except as listed in HPI above).   Objective  Vitals:   10/17/18 1426  BP: 130/60  Pulse: 70  Resp: 16  Temp: 97.9 F (36.6 C)  TempSrc: Oral  SpO2: 98%  Weight: 158 lb 9.6 oz (71.9 kg)  Height: 5\' 8"  (1.727 m)    Body mass index is 24.12 kg/m.  Physical Exam   Constitutional: Patient appears well-developed and frail with some temporal waisting .  No distress.  HENT: Head: Normocephalic and atraumatic. Ears: B TMs ok, no erythema or effusion; Nose: Nose normal. Mouth/Throat: Oropharynx is clear and moist. No oropharyngeal exudate.  Eyes: Conjunctivae and EOM are normal. Pupils are equal, round, and reactive to light. No scleral icterus.  Neck: Normal range of motion. Neck supple. No JVD present. No thyromegaly present.  Cardiovascular: Normal rate, regular rhythm and normal heart sounds.  No murmur heard. No BLE edema. Pulmonary/Chest: Effort normal and breath sounds normal. No respiratory distress. Abdominal: Soft. Bowel sounds are normal, no distension. There is no tenderness. no masses MALE GENITALIA: Not done, see Dr. Rogers Blocker RECTAL: sees Dr/ Rogers Blocker  Musculoskeletal: Normal range of motion, no joint effusions. No gross deformities Neurological: he is alert and oriented to person, place, and time. No cranial nerve deficit. Coordination, balance, strength, speech and gait are normal.  Skin: Skin is warm and dry. No rash noted. No erythema.  Psychiatric: Patient has a normal mood and affect. behavior is normal. Judgment and  thought content normal.  Recent Results (from the past 2160 hour(s))  Glucose, capillary     Status: Abnormal   Collection Time: 07/29/18  1:11 PM  Result Value Ref Range   Glucose-Capillary 64 (L) 70 - 99 mg/dL     PHQ2/9: Depression screen [facility] 2/9 10/17/2018 01/03/2018 09/18/2017 08/30/2017 06/13/2017  Decreased Interest 0 0 0 0 0  Down, Depressed, Hopeless 0 0 0 0 0  PHQ - 2 Score 0 0 0 0 0  Altered sleeping 0 2 - - -  Tired, decreased energy 0 0 - - -  Change in appetite 0 0 - - -  Feeling bad or failure about yourself  0 0 - - -  Trouble concentrating 0 0 - - -  Moving slowly or fidgety/restless 0 0 - - -  Suicidal thoughts 0 0 - - -  PHQ-9 Score 0 2 - - -  Difficult doing work/chores - Not difficult at all - - -    Fall  Risk: Fall Risk  10/17/2018 01/03/2018 11/08/2017 09/18/2017 08/30/2017  Falls in the past year? 0 No No No No  Number falls in past yr: 0 - - - -  Injury with Fall? 0 - - - -     Functional Status Survey: Is the patient deaf or have difficulty hearing?: No Does the patient have difficulty seeing, even when wearing glasses/contacts?: Yes Does the patient have difficulty concentrating, remembering, or making decisions?: No Does the patient have difficulty walking or climbing stairs?: No Does the patient have difficulty dressing or bathing?: No Does the patient have difficulty doing errands alone such as visiting a doctor's office or shopping?: No    Assessment & Plan  1. Dyslipidemia associated with type 2 diabetes mellitus (HCC)  - Lipid panel - COMPLETE METABOLIC PANEL WITH GFR - Hemoglobin A1c - Microalbumin / creatinine urine ratio - metFORMIN (GLUCOPHAGE) 500 MG tablet; Take 1 tablet (500 mg total) by mouth 2 (two) times daily with a meal.  Dispense: 180 tablet; Refill: 1  2. Need for vaccination for Strep pneumoniae  - Pneumococcal conjugate vaccine 13-valent  3. Hepatocellular carcinoma ([place])   4. Hyperthyroidism without crisis  - TSH  5.  Dyslipidemia  - Lipid panel  6. Encounter for routine history and physical exam for male   7. Anemia, unspecified type  - CBC with Differential/Platelet  8. Mild protein-calorie malnutrition ([place])  Continue high calorie diet   9. Mixed simple and mucopurulent chronic bronchitis ([place])  Doing better, does not want medication   10. Atherosclerosis of aorta (HCC)  On aspirin only   11. Chronic hepatitis C without hepatic coma ([place])   12. Gastroesophageal reflux disease, esophagitis presence not specified  - pantoprazole (PROTONIX) 40 MG tablet; Take 1 tablet (40 mg total) by mouth daily.  Dispense: 90 tablet; Refill: 1   -Prostate cancer screening and PSA options (with potential risks and benefits of testing vs not testing) were discussed along with recent recs/guidelines. -USPSTF grade A and B recommendations reviewed with patient; age-appropriate recommendations, preventive care, screening tests, etc discussed and encouraged; healthy living encouraged; see AVS for patient education given to patient -Discussed importance of 150 minutes of physical activity weekly, eat two servings of fish weekly, eat one serving of tree nuts ( cashews, pistachios, pecans, almonds.Marland Kitchen) every other day, eat 6 servings of fruit/vegetables daily and drink plenty of water and avoid sweet beverages.

## 2018-10-18 ENCOUNTER — Encounter: Payer: Self-pay | Admitting: Intensive Care

## 2018-10-18 ENCOUNTER — Emergency Department
Admission: EM | Admit: 2018-10-18 | Discharge: 2018-10-18 | Disposition: A | Discharge status: Home / Self Care | Payer: Medicare Other | Attending: Emergency Medicine | Admitting: Emergency Medicine

## 2018-10-18 ENCOUNTER — Emergency Department: Payer: Medicare Other

## 2018-10-18 ENCOUNTER — Other Ambulatory Visit: Payer: Self-pay

## 2018-10-18 DIAGNOSIS — E119 Type 2 diabetes mellitus without complications: Secondary | ICD-10-CM | POA: Insufficient documentation

## 2018-10-18 DIAGNOSIS — T50Z95A Adverse effect of other vaccines and biological substances, initial encounter: Secondary | ICD-10-CM | POA: Insufficient documentation

## 2018-10-18 DIAGNOSIS — Z79899 Other long term (current) drug therapy: Secondary | ICD-10-CM | POA: Insufficient documentation

## 2018-10-18 DIAGNOSIS — Z87891 Personal history of nicotine dependence: Secondary | ICD-10-CM | POA: Diagnosis not present

## 2018-10-18 DIAGNOSIS — R110 Nausea: Secondary | ICD-10-CM | POA: Insufficient documentation

## 2018-10-18 DIAGNOSIS — Z7982 Long term (current) use of aspirin: Secondary | ICD-10-CM | POA: Diagnosis not present

## 2018-10-18 DIAGNOSIS — R109 Unspecified abdominal pain: Secondary | ICD-10-CM | POA: Insufficient documentation

## 2018-10-18 LAB — COMPLETE METABOLIC PANEL WITH GFR
AG Ratio: 1.5 (calc) (ref 1.0–2.5)
ALT: 9 U/L (ref 9–46)
AST: 21 U/L (ref 10–35)
Albumin: 4.4 g/dL (ref 3.6–5.1)
Alkaline phosphatase (APISO): 84 U/L (ref 35–144)
BUN: 20 mg/dL (ref 7–25)
CO2: 25 mmol/L (ref 20–32)
Calcium: 9.5 mg/dL (ref 8.6–10.3)
Chloride: 102 mmol/L (ref 98–110)
Creat: 1.04 mg/dL (ref 0.70–1.18)
GFR, Est African American: 83 mL/min/{1.73_m2} (ref >=60)
GFR, Est Non African American: 72 mL/min/{1.73_m2} (ref >=60)
Globulin: 3 g/dL (calc) (ref 1.9–3.7)
Glucose, Bld: 89 mg/dL (ref 65–99)
Potassium: 4.2 mmol/L (ref 3.5–5.3)
Sodium: 139 mmol/L (ref 135–146)
Total Bilirubin: 0.3 mg/dL (ref 0.2–1.2)
Total Protein: 7.4 g/dL (ref 6.1–8.1)

## 2018-10-18 LAB — CBC
HCT: 33.8 % — ABNORMAL LOW (ref 39.0–52.0)
Hemoglobin: 10 g/dL — ABNORMAL LOW (ref 13.0–17.0)
MCH: 21.6 pg — ABNORMAL LOW (ref 26.0–34.0)
MCHC: 29.6 g/dL — ABNORMAL LOW (ref 30.0–36.0)
MCV: 73.2 fL — ABNORMAL LOW (ref 80.0–100.0)
Platelets: 186 10*3/uL (ref 150–400)
RBC: 4.62 MIL/uL (ref 4.22–5.81)
RDW: 16.6 % — ABNORMAL HIGH (ref 11.5–15.5)
WBC: 7.5 10*3/uL (ref 4.0–10.5)
nRBC: 0 % (ref 0.0–0.2)

## 2018-10-18 LAB — LIPASE, BLOOD: Lipase: 22 U/L (ref 11–51)

## 2018-10-18 LAB — COMPREHENSIVE METABOLIC PANEL
ALT: 11 U/L (ref 0–44)
AST: 22 U/L (ref 15–41)
Albumin: 4.4 g/dL (ref 3.5–5.0)
Alkaline Phosphatase: 67 U/L (ref 38–126)
Anion gap: 12 (ref 5–15)
BUN: 18 mg/dL (ref 8–23)
CO2: 24 mmol/L (ref 22–32)
Calcium: 9.1 mg/dL (ref 8.9–10.3)
Chloride: 95 mmol/L — ABNORMAL LOW (ref 98–111)
Creatinine, Ser: 1.02 mg/dL (ref 0.61–1.24)
GFR calc Af Amer: >60 mL/min (ref >60)
GFR calc non Af Amer: >60 mL/min (ref >60)
Glucose, Bld: 94 mg/dL (ref 70–99)
Potassium: 3.9 mmol/L (ref 3.5–5.1)
Sodium: 131 mmol/L — ABNORMAL LOW (ref 135–145)
Total Bilirubin: 0.6 mg/dL (ref 0.3–1.2)
Total Protein: 8 g/dL (ref 6.5–8.1)

## 2018-10-18 LAB — HEMOGLOBIN A1C
Hgb A1c MFr Bld: 6.4 % of total Hgb — ABNORMAL HIGH (ref <5.7)
Mean Plasma Glucose: 137 (calc)
eAG (mmol/L): 7.6 (calc)

## 2018-10-18 LAB — TSH: TSH: 0.54 mIU/L (ref 0.40–4.50)

## 2018-10-18 LAB — URINALYSIS, COMPLETE (UACMP) WITH MICROSCOPIC
Bacteria, UA: NONE SEEN
Bilirubin Urine: NEGATIVE
Glucose, UA: NEGATIVE mg/dL
Ketones, ur: NEGATIVE mg/dL
Leukocytes,Ua: NEGATIVE
Nitrite: NEGATIVE
Protein, ur: NEGATIVE mg/dL
Specific Gravity, Urine: >1.046 — ABNORMAL HIGH (ref 1.005–1.030)
Squamous Epithelial / HPF: NONE SEEN (ref 0–5)
pH: 5 (ref 5.0–8.0)

## 2018-10-18 LAB — CBC WITH DIFFERENTIAL/PLATELET
Absolute Monocytes: 304 cells/uL (ref 200–950)
Basophils Absolute: 20 cells/uL (ref 0–200)
Basophils Relative: 0.5 %
Eosinophils Absolute: 132 cells/uL (ref 15–500)
Eosinophils Relative: 3.3 %
HCT: 31.2 % — ABNORMAL LOW (ref 38.5–50.0)
Hemoglobin: 9.3 g/dL — ABNORMAL LOW (ref 13.2–17.1)
Lymphs Abs: 1104 cells/uL (ref 850–3900)
MCH: 21.8 pg — ABNORMAL LOW (ref 27.0–33.0)
MCHC: 29.8 g/dL — ABNORMAL LOW (ref 32.0–36.0)
MCV: 73.2 fL — ABNORMAL LOW (ref 80.0–100.0)
MPV: 10.4 fL (ref 7.5–12.5)
Monocytes Relative: 7.6 %
Neutro Abs: 2440 cells/uL (ref 1500–7800)
Neutrophils Relative %: 61 %
Platelets: 223 10*3/uL (ref 140–400)
RBC: 4.26 10*6/uL (ref 4.20–5.80)
RDW: 15.7 % — ABNORMAL HIGH (ref 11.0–15.0)
Total Lymphocyte: 27.6 %
WBC: 4 10*3/uL (ref 3.8–10.8)

## 2018-10-18 LAB — LIPID PANEL
Cholesterol: 217 mg/dL — ABNORMAL HIGH (ref <200)
HDL: 94 mg/dL (ref >=40)
LDL Cholesterol (Calc): 105 mg/dL (calc) — ABNORMAL HIGH
Non-HDL Cholesterol (Calc): 123 mg/dL (calc) (ref <130)
Total CHOL/HDL Ratio: 2.3 (calc) (ref <5.0)
Triglycerides: 87 mg/dL (ref <150)

## 2018-10-18 LAB — MICROALBUMIN / CREATININE URINE RATIO
Creatinine, Urine: 116 mg/dL (ref 20–320)
Microalb Creat Ratio: 5 mcg/mg creat (ref <30)
Microalb, Ur: 0.6 mg/dL

## 2018-10-18 MED ORDER — ALUM & MAG HYDROXIDE-SIMETH 200-200-20 MG/5ML PO SUSP
15.0000 mL | Freq: Once | ORAL | Status: AC
  Administered 2018-10-18: 15 mL via ORAL
  Filled 2018-10-18: qty 30

## 2018-10-18 MED ORDER — IOHEXOL 240 MG/ML SOLN
50.0000 mL | Freq: Once | INTRAMUSCULAR | Status: AC
  Administered 2018-10-18: 50 mL via ORAL

## 2018-10-18 MED ORDER — ONDANSETRON HCL 4 MG/2ML IJ SOLN
4.0000 mg | Freq: Once | INTRAMUSCULAR | Status: AC
  Administered 2018-10-18: 4 mg via INTRAVENOUS
  Filled 2018-10-18: qty 2

## 2018-10-18 MED ORDER — ONDANSETRON 4 MG PO TBDP: 4.0000 mg | ORAL_TABLET | Freq: Four times a day (QID) | ORAL | 0 refills | Status: DC | PRN

## 2018-10-18 MED ORDER — IOHEXOL 300 MG/ML  SOLN
100.0000 mL | Freq: Once | INTRAMUSCULAR | Status: AC | PRN
  Administered 2018-10-18: 100 mL via INTRAVENOUS

## 2018-10-18 MED ORDER — MORPHINE SULFATE (PF) 4 MG/ML IV SOLN
4.0000 mg | Freq: Once | INTRAVENOUS | Status: AC
  Administered 2018-10-18: 4 mg via INTRAVENOUS
  Filled 2018-10-18: qty 1

## 2018-10-18 NOTE — Discharge Instructions (Signed)
You were seen in the emergency room for abdominal pain. It is important that you follow up closely with your primary care doctor in the next couple of days. ° ° °Please return to the emergency room right away if you are to develop a fever, severe nausea, your pain becomes severe or worsens, you are unable to keep food down, begin vomiting any dark or bloody fluid, you develop any dark or bloody stools, feel dehydrated, or other new concerns or symptoms arise. ° ° °

## 2018-10-18 NOTE — ED Notes (Signed)
Patient is done with both bottles of oral contrast. CT scan tech aware.

## 2018-10-18 NOTE — ED Notes (Signed)
Patient states his bowel movement yesterday was hard and scant. Patient states he took two Senokot today without relief.

## 2018-10-18 NOTE — ED Provider Notes (Signed)
[facility] Emergency Department Provider Note   ____________________________________________   First MD Initiated Contact with Patient 10/18/18 1623     (approximate)  I have reviewed the triage vital signs and the nursing notes.   HISTORY  Chief Complaint Abdominal Pain    HPI Rodney Moody is a 73 y.o. male here for evaluation of abdominal pain  Patient reports yesterday he had a regular physical examination, they gave him a Pneumovax shot.  Not long after he left the clinic he started feel little nauseated a little crampy in his stomach.  He notes he has a little bit of achiness and a low-grade fever.  Comes today because he continues to have pain just in the middle of his stomach with nausea.  He has not noticed any ongoing fever.  There is no chest pain no shortness of breath.  He does have a small spot that is being treated with radiation in his right lung by his [place] doctor, he has a history of prior hepatitis C and diabetes, but reports overall his health been quite good lately.   Patient reports he is pretty certain that the shot caused the symptoms but not quite sure if he is still not feeling too great but is starting to feel better.  Normal bowel movement yesterday.  No black or bloody stool.  No cough.  No difficulty breathing.  Pain is moderate mid abdomen a little crampy with nausea  Past Medical History:  Diagnosis Date   Cirrhosis of liver ([place])    Diabetes mellitus without complication ([place])    borderline Diabetes, but no medication   Gastritis    GERD (gastroesophageal reflux disease)    Hepatitis B    Hepatitis C    with cirrhosis   Hepatocellular carcinoma (HCC) 10/06/2015   Hyperlipidemia    Prostate cancer [facility])     Patient Active Problem List   Diagnosis Date Noted   Mild protein-calorie malnutrition ([place]) 10/17/2018   Mixed simple and mucopurulent chronic bronchitis ([place]) 10/17/2018   Hypomagnesemia  03/18/2018   Atherosclerosis of abdominal aorta ([place]) 01/03/2018   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   Vitamin D deficiency 08/30/2017   H/O malignant neoplasm of prostate 08/30/2017   Hyperthyroidism without crisis 12/28/2016   Chronic viral hepatitis C ([place]) 02/07/2016   Low back pain 12/16/2015   Hepatocellular carcinoma ([place]) 08/16/2015   Elevated liver enzymes 03/18/2015   GERD (gastroesophageal reflux disease) 11/19/2014   Dyslipidemia associated with type 2 diabetes mellitus ([place]) 11/19/2014   Prostate cancer ([place]) 11/19/2014   Dyslipidemia 11/19/2014   Hyperlipidemia, unspecified 11/19/2014    Past Surgical History:  Procedure Laterality Date   COLONOSCOPY W/ BIOPSIES  2014   Patient has polyps-benign   COLONOSCOPY WITH PROPOFOL N/A 06/04/2016   Procedure: COLONOSCOPY WITH PROPOFOL;  Surgeon: Lollie Sails, MD;  Location: [facility] ENDOSCOPY;  Service: Endoscopy;  Laterality: N/A;   COLONOSCOPY WITH PROPOFOL N/A 05/16/2018   Procedure: COLONOSCOPY WITH PROPOFOL;  Surgeon: Lollie Sails, MD;  Location: [facility] ENDOSCOPY;  Service: Endoscopy;  Laterality: N/A;   ESOPHAGOGASTRODUODENOSCOPY N/A 06/04/2016   Procedure: ESOPHAGOGASTRODUODENOSCOPY (EGD);  Surgeon: Lollie Sails, MD;  Location: [facility] ENDOSCOPY;  Service: Endoscopy;  Laterality: N/A;   laparoscopic resection of liver     left shoulder     PROSTATE BIOPSY      Prior to Admission medications   Medication Sig Start Date End Date Taking? Authorizing Provider  aspirin 81 MG tablet Take  81 mg by mouth daily.    [provider]  loperamide (IMODIUM) 2 MG capsule Take by mouth.    [provider]  metFORMIN (GLUCOPHAGE) 500 MG tablet Take 1 tablet (500 mg total) by mouth 2 (two) times daily with a meal. 10/17/18 01/15/19  Ancil Boozer, Drue Stager, MD  ondansetron (ZOFRAN ODT) 4 MG disintegrating tablet Take 1 tablet (4 mg total) by mouth every 6 (six) hours as needed for  nausea or vomiting. 10/18/18   Delman Kitten, MD  pantoprazole (PROTONIX) 40 MG tablet Take 1 tablet (40 mg total) by mouth daily. 10/17/18   Steele Sizer, MD    Allergies Lisinopril  Family History  Problem Relation Age of Onset   Diabetes Mother    Hypertension Mother    Heart disease Brother    Hypertension Brother    Hypertension Brother    Heart attack Brother     Social History Social History   Tobacco Use   Smoking status: Former Smoker    Packs/day: 0.25    Years: 30.00    Pack years: 7.50    Types: Cigarettes    Quit date: 02/25/1998    Years since quitting: 20.6   Smokeless tobacco: Never Used  Substance Use Topics   Alcohol use: No    Alcohol/week: 0.0 standard drinks    Comment: quit in 1999  was not a heavy drinker   Drug use: No    Comment: remote injection drug use in 20"s    Review of Systems Constitutional: See HPI slight fatigue Eyes: No visual changes. ENT: No sore throat. Cardiovascular: Denies chest pain. Respiratory: Denies shortness of breath. Gastrointestinal: See HPI Genitourinary: Negative for dysuria. Musculoskeletal: Negative for back pain. Skin: Negative for rash. Neurological: Negative for headaches, areas of focal weakness or numbness.    ____________________________________________   PHYSICAL EXAM:  VITAL SIGNS: ED Triage Vitals  Enc Vitals Group     BP 10/18/18 1607 120/79     Pulse Rate 10/18/18 1607 88     Resp 10/18/18 1629 14     Temp 10/18/18 1607 100.1 F (37.8 C)     Temp Source 10/18/18 1607 Oral     SpO2 10/18/18 1607 100 %     Weight 10/18/18 1607 158 lb (71.7 kg)     Height 10/18/18 1607 5\' 8"  (1.727 m)     Head Circumference --      Peak Flow --      Pain Score 10/18/18 1616 7     Pain Loc --      Pain Edu? --      Excl. in [place]? --     Constitutional: Alert and oriented. Well appearing and in no acute distress.  He is very pleasant. Eyes: Conjunctivae are normal. Head: Atraumatic. Nose:  No congestion/rhinnorhea. Mouth/Throat: Mucous membranes are moist. Neck: No stridor.  Cardiovascular: Normal rate, regular rhythm. Grossly normal heart sounds.  Good peripheral circulation. Respiratory: Normal respiratory effort.  No retractions. Lungs CTAB. Gastrointestinal: Soft and nontender except for some mild discomfort reports mid epigastric. No distention.  No rebound or guarding.  No distention. Musculoskeletal: No lower extremity tenderness nor edema. Neurologic:  Normal speech and language. No gross focal neurologic deficits are appreciated.  Skin:  Skin is warm, dry and intact. No rash noted. Psychiatric: Mood and affect are normal. Speech and behavior are normal.  ____________________________________________   LABS (all labs ordered are listed, but only abnormal results are displayed)  Labs Reviewed  COMPREHENSIVE METABOLIC PANEL -  Abnormal; Notable for the following components:      Result Value   Sodium 131 (*)    Chloride 95 (*)    All other components within normal limits  CBC - Abnormal; Notable for the following components:   Hemoglobin 10.0 (*)    HCT 33.8 (*)    MCV 73.2 (*)    MCH 21.6 (*)    MCHC 29.6 (*)    RDW 16.6 (*)    All other components within normal limits  URINALYSIS, COMPLETE (UACMP) WITH MICROSCOPIC - Abnormal; Notable for the following components:   Color, Urine YELLOW (*)    APPearance CLEAR (*)    Specific Gravity, Urine >1.046 (*)    Hgb urine dipstick SMALL (*)    All other components within normal limits  LIPASE, BLOOD   ____________________________________________  EKG  ED ECG REPORT I, Delman Kitten, the attending physician, personally viewed and interpreted this ECG.  Date: 10/18/2018 EKG Time: 1625 Rate: 80 Rhythm: normal sinus rhythm QRS Axis: Right bundle branch block Intervals: normal ST/T Wave abnormalities: normal Narrative Interpretation: no evidence of acute ischemia, right bundle branch block without  ischemia  ____________________________________________  RADIOLOGY  Ct Abdomen Pelvis W Contrast  Result Date: 10/18/2018 CLINICAL DATA:  Fever mid abdominal pain. EXAM: CT ABDOMEN AND PELVIS WITH CONTRAST TECHNIQUE: Multidetector CT imaging of the abdomen and pelvis was performed using the standard protocol following bolus administration of intravenous contrast. CONTRAST:  122mL OMNIPAQUE IOHEXOL 300 MG/ML SOLN, 50mL OMNIPAQUE IOHEXOL 240 MG/ML SOLN COMPARISON:  07/29/2018, PET-CT FINDINGS: Lower chest: No acute abnormality. Hepatobiliary: Post left hepatectomy. Normal appearance of the remaining liver parenchyma. Normal appearance of the gallbladder. Pancreas: Unremarkable. No pancreatic ductal dilatation or surrounding inflammatory changes. Spleen: Normal in size without focal abnormality. Adrenals/Urinary Tract: Normal adrenal glands. Bilateral circumscribed hypoattenuated renal masses, some of which represent cysts, some of which too small to be actually characterize by CT. Normal appearance of the urinary bladder. Stomach/Bowel: Stomach is within normal limits. Appendix appears normal. No evidence of bowel wall thickening, distention, or inflammatory changes. Scattered left colonic diverticulosis. No evidence of diverticulitis. Vascular/Lymphatic: Aortic atherosclerosis. No enlarged abdominal or pelvic lymph nodes. Reproductive: Radiation seeds within the prostate gland. Other: No abdominal wall hernia or abnormality. No abdominopelvic ascites. Musculoskeletal: Spondylosis of the lumbosacral spine. IMPRESSION: 1. No evidence of acute abnormality within the abdomen or pelvis. Stable postsurgical changes from left hepatectomy. 2. Bilateral renal masses, some of which represent cysts, some of which are too small to be actually characterize by CT. 3. Scattered left colonic diverticulosis without evidence of diverticulitis. 4. Calcific atherosclerotic disease of the aorta. Electronically Signed   By:  Fidela Salisbury M.D.   On: 10/18/2018 17:37   Dg Chest Port 1 View  Result Date: 10/18/2018 CLINICAL DATA:  Abdominal pain and feeling ill. EXAM: PORTABLE CHEST 1 VIEW COMPARISON:  PET-CT 07/29/2018 FINDINGS: Cardiomediastinal silhouette is normal. Mediastinal contours appear intact. 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 There is no evidence of focal airspace consolidation, pleural effusion or pneumothorax. Osseous structures are without acute abnormality. Soft tissues are grossly normal. IMPRESSION: 1. No active disease. 2. 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 Electronically Signed   By: Fidela Salisbury M.D.   On: 10/18/2018 17:07    CT imaging results reviewed, no acute abdominal findings.  Negative for  acute finding.  Patient has known lesion in the right upper lobe following with [facility] oncology ____________________________________________   PROCEDURES  Procedure(s) performed: None  Procedures  Critical Care performed: No  ____________________________________________   INITIAL IMPRESSION / ASSESSMENT AND PLAN / ED COURSE  Pertinent labs & imaging results that were available during my care of the patient were reviewed by me and considered in my medical decision making (see chart for details).   Reassuring examination, however given the patient's age and complaint of nausea and mid abdominal pain will proceed with CT scan to evaluate for intra-abdominal etiology.  Does not appear to have any symptoms of acute pneumonia, coronavirus, pulmonary or cardiac symptoms.  No neurologic symptoms.  Very reassuring exam.  Low-grade temperature is nonspecific, could be secondary to his Pneumovax but also consideration for infectious etiology, viral cause, etc. is considered.  Plan to  hydrate, provide antiemetic, GI medication and reassess  ----------------------------------------- 8:16 PM on 10/18/2018 -----------------------------------------  Patient feeling quite a bit better, reports he feels a lot better actually.  Appears well.  Awaiting urinalysis    Return precautions and treatment recommendations and follow-up discussed with the patient who is agreeable with the plan.   ____________________________________________   FINAL CLINICAL IMPRESSION(S) / ED DIAGNOSES  Final diagnoses:  Nausea  Abdominal pain, unspecified abdominal location  Vaccination reaction, initial encounter        Note:  This document was prepared using Dragon voice recognition software and may include unintentional dictation errors       Delman Kitten, MD 10/18/18 2209

## 2018-10-18 NOTE — ED Triage Notes (Signed)
PAtient c/o abd pain since yesterday. Reports he has physical yesterday and had a pneumococcal conjugate vaccine. A couple hours after shot patient reports feeling ill. Last BM yesterday.

## 2018-10-18 NOTE — ED Notes (Signed)
Patient transported to CT 

## 2018-10-18 NOTE — ED Notes (Signed)
Patient declined something to eat or drink. Waiting for results of urinalysis.

## 2018-12-05 ENCOUNTER — Ambulatory Visit: Admission: RE | Admit: 2018-12-05 | Payer: BC Managed Care – PPO | Source: Ambulatory Visit

## 2018-12-12 ENCOUNTER — Ambulatory Visit: Payer: BC Managed Care – PPO | Attending: Radiation Oncology | Admitting: Radiation Oncology

## 2019-04-17 ENCOUNTER — Ambulatory Visit: Payer: BC Managed Care – PPO | Admitting: Family Medicine

## 2019-04-17 ENCOUNTER — Other Ambulatory Visit: Payer: Self-pay | Admitting: Family Medicine

## 2019-04-17 DIAGNOSIS — K219 Gastro-esophageal reflux disease without esophagitis: Secondary | ICD-10-CM

## 2019-04-17 DIAGNOSIS — E785 Hyperlipidemia, unspecified: Secondary | ICD-10-CM

## 2019-04-17 DIAGNOSIS — E1169 Type 2 diabetes mellitus with other specified complication: Secondary | ICD-10-CM

## 2019-05-15 ENCOUNTER — Other Ambulatory Visit: Payer: Self-pay

## 2019-05-15 ENCOUNTER — Ambulatory Visit: Payer: BC Managed Care – PPO | Admitting: Family Medicine

## 2019-05-15 ENCOUNTER — Encounter: Payer: Self-pay | Admitting: Family Medicine

## 2019-05-15 VITALS — BP 116/60 | HR 63 | Temp 97.3°F | Resp 16 | Ht 68.0 in | Wt 155.4 lb

## 2019-05-15 DIAGNOSIS — J418 Mixed simple and mucopurulent chronic bronchitis: Secondary | ICD-10-CM

## 2019-05-15 DIAGNOSIS — E785 Hyperlipidemia, unspecified: Secondary | ICD-10-CM | POA: Diagnosis not present

## 2019-05-15 DIAGNOSIS — C7801 Secondary malignant neoplasm of right lung: Secondary | ICD-10-CM

## 2019-05-15 DIAGNOSIS — K2930 Chronic superficial gastritis without bleeding: Secondary | ICD-10-CM

## 2019-05-15 DIAGNOSIS — C228 Malignant neoplasm of liver, primary, unspecified as to type: Secondary | ICD-10-CM

## 2019-05-15 DIAGNOSIS — D509 Iron deficiency anemia, unspecified: Secondary | ICD-10-CM

## 2019-05-15 DIAGNOSIS — Z8619 Personal history of other infectious and parasitic diseases: Secondary | ICD-10-CM

## 2019-05-15 DIAGNOSIS — E1169 Type 2 diabetes mellitus with other specified complication: Secondary | ICD-10-CM

## 2019-05-15 DIAGNOSIS — I700 Atherosclerosis of aorta: Secondary | ICD-10-CM

## 2019-05-15 LAB — POCT GLYCOSYLATED HEMOGLOBIN (HGB A1C): Hemoglobin A1C: 5.7 % — AB (ref 4.0–5.6)

## 2019-05-15 MED ORDER — METFORMIN HCL 500 MG PO TABS: 500.0000 mg | ORAL_TABLET | Freq: Every day | ORAL | 1 refills | Status: DC

## 2019-05-15 NOTE — Progress Notes (Signed)
Name: Rodney Moody   MRN: [id number]    DOB: [birth year]   Date:05/15/2019       Progress Note  Subjective  Chief Complaint  Chief Complaint  Patient presents with  . Diabetes  . Dyslipidemia  . Gastroesophageal Reflux  . Hypothyroidism    HPI  History of hepatitis C, with liver cancer and mets to mediastinal area: seeing Oncologist at [facility], Dr. Leamon Arnt also radiation oncologist Dr. Donella Stade, he was on a trial at [facility] ([facility] but last treatment was April 2020. He denies pain, jaundice or tremors, no confusion, urine is normal color, denies acholic stools.He states treatment affected his appetite and weight was 184 lbs in 2017 before hepatic cancer surgery but after that weight has been doing to  155 lbs, he states keeps his follow up at [facility]. Reviewed records, last hepatitis C level was negative, he also has mets to lung and mediastinum now and has CT's done at [facility]  Chronic bronchitis: worse in  May 2019, seen by Clyde Lundborg and also seen pulmonologist Dr. Felicie Morn. He is feeling better, back to baseline  mild cough in am, sometimes productive, no wheezing or sob, not on medications. Quit smoking back in 1999   DMII: hgbA1C was 7.0% July 2018, but has lost weight, 04/2017 hgbA1C 6.5%,5.9%last visit it was  6.3% but down again, we will change to Metformin 500 mg daily instead of BID since his weight is going down.  Glucoseat home has been well controlled.  He denies polyphagia, polydipsia or polyuria.  He has dyslipidemia, but doing well on medication   GERD: taking medication and denies side effects he wakes up with a dry mouth, but controlled with medication . He had EGD recently done at [facility] that showed chronic gastritis and duodenitis. He was being evaluated for anemia, unknown cause and is now on iron supplementation   Hyperthyroidism: off medication, no palpitation, but has some diarrhea,last TSH done at [facility] 11/2017 and normal again in our office in 2020   Dyslipidemia:  not currently on medication, hashistory of liver cancer and is not interested even though explained to him today that he has aorta atherosclerosis also. He has been taking aspirin 81 mg daily. Continue to monitor   History of prostate cancer: under the care of Dr. Eliberto Ivory , he has Myrtetriq at home and he only takes it prn   Malnutrition: multifactorial, started to lose weight when on treatment for cancerthat started in 0000000. Baseline weight around 184 lbs, and was  down to 152.9 lbs and is still 155 lbs today, he states appetite not like it used to be    Patient Active Problem List   Diagnosis Date Noted  . Mixed simple and mucopurulent chronic bronchitis ([place]) 10/17/2018  . Hypomagnesemia 03/18/2018  . Atherosclerosis of abdominal aorta ([place]) 01/03/2018  . 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 Vitamin D deficiency 08/30/2017  . H/O malignant neoplasm of prostate 08/30/2017  . Hyperthyroidism without crisis 12/28/2016  . Low back pain 12/16/2015  . Hepatocellular carcinoma ([place]) 08/16/2015  . Elevated liver enzymes 03/18/2015  . GERD (gastroesophageal reflux disease) 11/19/2014  . Dyslipidemia associated with type 2 diabetes mellitus ([place]) 11/19/2014  . Prostate cancer ([place]) 11/19/2014  . Dyslipidemia 11/19/2014  . Hyperlipidemia, unspecified 11/19/2014    Past Surgical History:  Procedure Laterality Date  . COLONOSCOPY W/ BIOPSIES  2014   Patient has polyps-benign  . COLONOSCOPY WITH PROPOFOL N/A 06/04/2016   Procedure: COLONOSCOPY WITH PROPOFOL;  Surgeon: Billie Ruddy  Gustavo Lah, MD;  Location: [facility] ENDOSCOPY;  Service: Endoscopy;  Laterality: N/A;  . COLONOSCOPY WITH PROPOFOL N/A 05/16/2018   Procedure: COLONOSCOPY WITH PROPOFOL;  Surgeon: Lollie Sails, MD;  Location: [facility] ENDOSCOPY;  Service: Endoscopy;  Laterality: N/A;  . ESOPHAGOGASTRODUODENOSCOPY N/A 06/04/2016   Procedure: ESOPHAGOGASTRODUODENOSCOPY (EGD);  Surgeon: Lollie Sails, MD;  Location:  [facility] ENDOSCOPY;  Service: Endoscopy;  Laterality: N/A;  . laparoscopic resection of liver    . left shoulder    . PROSTATE BIOPSY      Family History  Problem Relation Age of Onset  . Diabetes Mother   . Hypertension Mother   . Heart disease Brother   . Hypertension Brother   . Hypertension Brother   . Heart attack Brother      Current Outpatient Medications:  .  aspirin 81 MG tablet, Take 81 mg by mouth daily., Disp: , Rfl:  .  clotrimazole-betamethasone (LOTRISONE) cream, APPLY TO AFFECTED & SURROUNDING AREAS OF SKIN 2 TIMES PER DAY MORNING AND EVENING FOR 2 WEEKS, Disp: , Rfl:  .  metFORMIN (GLUCOPHAGE) 500 MG tablet, Take 1 tablet (500 mg total) by mouth daily with breakfast., Disp: 90 tablet, Rfl: 1 .  mirabegron ER (MYRBETRIQ) 25 MG TB24 tablet, Take by mouth., Disp: , Rfl:  .  pantoprazole (PROTONIX) 40 MG tablet, TAKE 1 TABLET BY MOUTH EVERY DAY, Disp: 90 tablet, Rfl: 1  Allergies  Allergen Reactions  . Lisinopril Cough    I personally reviewed active problem list, medication list, allergies, family history, social history with the patient/caregiver today.   ROS  Constitutional: Negative for fever or weight change.  Respiratory: Positive  for cough but no shortness of breath.   Cardiovascular: Negative for chest pain or palpitations.  Gastrointestinal: Negative for abdominal pain, no bowel changes.  Musculoskeletal: Negative for gait problem or joint swelling.  Skin: Negative for rash.  Neurological: Negative for dizziness or headache.  No other specific complaints in a complete review of systems (except as listed in HPI above).  Objective  Vitals:   05/15/19 1329  BP: 116/60  Pulse: 63  Resp: 16  Temp: (!) 97.3 F (36.3 C)  TempSrc: Temporal  SpO2: 98%  Weight: 155 lb 6.4 oz (70.5 kg)  Height: 5\' 8"  (1.727 m)    Body mass index is 23.63 kg/m.  Physical Exam  Constitutional: Patient appears well-developed and very thin with some temporal waiting   HEENT: head atraumatic, normocephalic, pupils equal and reactive to light Cardiovascular: Normal rate, regular rhythm and normal heart sounds.  No murmur heard. No BLE edema. Pulmonary/Chest: Effort normal and breath sounds normal. No respiratory distress. Abdominal: Soft.  There is no tenderness. Normal bowel sounds  Psychiatric: Patient has a normal mood and affect. behavior is normal. Judgment and thought content normal.  Recent Results (from the past 2160 hour(s))  POCT HgB A1C     Status: Abnormal   Collection Time: 05/15/19  1:38 PM  Result Value Ref Range   Hemoglobin A1C 5.7 (A) 4.0 - 5.6 %   HbA1c POC (<> result, manual entry)     HbA1c, POC (prediabetic range)     HbA1c, POC (controlled diabetic range)      Diabetic Foot Exam: Diabetic Foot Exam - Simple   Simple Foot Form Diabetic Foot exam was performed with the following findings: Yes 05/15/2019  1:44 PM  Visual Inspection No deformities, no ulcerations, no other skin breakdown bilaterally: Yes Sensation Testing Intact to touch and monofilament testing bilaterally:  Yes Pulse Check Posterior Tibialis and Dorsalis pulse intact bilaterally: Yes Comments      PHQ2/9: Depression screen [facility] 2/9 05/15/2019 10/17/2018 01/03/2018 09/18/2017 08/30/2017  Decreased Interest 0 0 0 0 0  Down, Depressed, Hopeless 0 0 0 0 0  PHQ - 2 Score 0 0 0 0 0  Altered sleeping 0 0 2 - -  Tired, decreased energy 0 0 0 - -  Change in appetite 0 0 0 - -  Feeling bad or failure about yourself  0 0 0 - -  Trouble concentrating 0 0 0 - -  Moving slowly or fidgety/restless 0 0 0 - -  Suicidal thoughts 0 0 0 - -  PHQ-9 Score 0 0 2 - -  Difficult doing work/chores - - Not difficult at all - -    phq 9 is negative   Fall Risk: Fall Risk  05/15/2019 10/17/2018 01/03/2018 11/08/2017 09/18/2017  Falls in the past year? 0 0 No No No  Number falls in past yr: 0 0 - - -  Injury with Fall? 0 0 - - -     Functional Status Survey: Is the patient deaf or  have difficulty hearing?: No Does the patient have difficulty seeing, even when wearing glasses/contacts?: No Does the patient have difficulty concentrating, remembering, or making decisions?: No Does the patient have difficulty walking or climbing stairs?: No Does the patient have difficulty dressing or bathing?: No Does the patient have difficulty doing errands alone such as visiting a doctor's office or shopping?: No    Assessment & Plan  1. Dyslipidemia associated with type 2 diabetes mellitus (HCC)  - POCT HgB A1C - metFORMIN (GLUCOPHAGE) 500 MG tablet; Take 1 tablet (500 mg total) by mouth daily with breakfast.  Dispense: 90 tablet; Refill: 1  2. Chronic superficial gastritis without bleeding   3. Iron deficiency anemia, unspecified iron deficiency anemia type   4. Dyslipidemia  Not on statin therapy states was told not to take it   5. Atherosclerosis of aorta ([place])  Unable to take statin therapy   6. History of hepatitis C virus infection  Treated   7. Liver cancer, primary, with metastasis from liver to other site [facility])  No sob, he is compliant with follow with sub-specialist   8. 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 Mixed simple and mucopurulent chronic bronchitis (HCC)  Stable

## 2019-06-28 ENCOUNTER — Ambulatory Visit: Payer: BC Managed Care – PPO | Attending: Internal Medicine

## 2019-06-28 DIAGNOSIS — Z23 Encounter for immunization: Secondary | ICD-10-CM | POA: Insufficient documentation

## 2019-06-28 NOTE — Progress Notes (Signed)
   Covid-19 Vaccination Clinic  Name:  Rodney Moody    MRN: [id number] DOB: 1946-03-30  06/28/2019  Rodney Moody was observed post Covid-19 immunization for 30 minutes based on pre-vaccination screening without incidence. He was provided with Vaccine Information Sheet and instruction to access the V-Safe system.   Rodney Moody was instructed to call 911 with any severe reactions post vaccine: Marland Kitchen Difficulty breathing  . Swelling of your face and throat  . A fast heartbeat  . A bad rash all over your body  . Dizziness and weakness    Immunizations Administered    Name Date Dose VIS Date Route   Pfizer COVID-19 Vaccine 06/28/2019  9:31 AM 0.3 mL 04/10/2019 Intramuscular   Manufacturer: [place]   Lot: [id number]   [place]: [id number]

## 2019-07-20 ENCOUNTER — Ambulatory Visit: Payer: BC Managed Care – PPO | Attending: Internal Medicine

## 2019-07-20 DIAGNOSIS — Z23 Encounter for immunization: Secondary | ICD-10-CM

## 2019-07-20 NOTE — Progress Notes (Signed)
   Covid-19 Vaccination Clinic  Name:  Rodney Moody    MRN: [id number] DOB: 03/30/1946  07/20/2019  [name] was observed post Covid-19 immunization for 15 minutes without incident. He was provided with Vaccine Information Sheet and instruction to access the V-Safe system.   [name] was instructed to call 911 with any severe reactions post vaccine: Marland Kitchen Difficulty breathing  . Swelling of face and throat  . A fast heartbeat  . A bad rash all over body  . Dizziness and weakness   Immunizations Administered    Name Date Dose VIS Date Route   Pfizer COVID-19 Vaccine 07/20/2019  8:47 AM 0.3 mL 04/10/2019 Intramuscular   Manufacturer: [place]   Lot: [id number]   [place]: [id number]

## 2019-08-24 ENCOUNTER — Telehealth: Payer: Self-pay | Admitting: Family Medicine

## 2019-08-24 NOTE — Telephone Encounter (Signed)
Attempted to schedule AWV. Unable to LVM.  Will try at later time. No voicemail. 

## 2019-10-17 ENCOUNTER — Other Ambulatory Visit: Payer: Self-pay | Admitting: Family Medicine

## 2019-10-17 DIAGNOSIS — K219 Gastro-esophageal reflux disease without esophagitis: Secondary | ICD-10-CM

## 2019-10-17 NOTE — Telephone Encounter (Signed)
Requested Prescriptions  Pending Prescriptions Disp Refills   pantoprazole (PROTONIX) 40 MG tablet [Pharmacy Med Name: PANTOPRAZOLE SOD DR 40 MG TAB] 90 tablet 1    Sig: TAKE 1 TABLET BY MOUTH EVERY DAY     Gastroenterology: Proton Pump Inhibitors Passed - 10/17/2019  9:32 AM      Passed - Valid encounter within last 12 months    Recent Outpatient Visits          5 months ago Dyslipidemia associated with type 2 diabetes mellitus [facility])   [facility] [place], Drue Stager, MD   1 year ago Dyslipidemia associated with type 2 diabetes mellitus [facility])   [facility] [place], Drue Stager, MD   1 year ago Dyslipidemia associated with type 2 diabetes mellitus [facility])   [facility] Steele Sizer, MD   1 year ago Chronic cough   [place], NP   2 years ago Dyslipidemia associated with type 2 diabetes mellitus [facility])   [facility] Steele Sizer, MD      Future Appointments            In 4 months Ancil Boozer, Drue Stager, MD [facility], [facility]

## 2019-11-13 ENCOUNTER — Encounter: Payer: BC Managed Care – PPO | Admitting: Family Medicine

## 2019-11-15 ENCOUNTER — Other Ambulatory Visit: Payer: Self-pay | Admitting: Family Medicine

## 2019-11-15 DIAGNOSIS — E1169 Type 2 diabetes mellitus with other specified complication: Secondary | ICD-10-CM

## 2019-11-15 NOTE — Telephone Encounter (Signed)
Requested medication (s) are due for refill today: yes  Requested medication (s) are on the active medication list: prescription expired 08/13/19  Last refill:  05/05/19  Future visit scheduled: yes  Notes to clinic:  prescription expired please review   Requested Prescriptions  Pending Prescriptions Disp Refills   metFORMIN (GLUCOPHAGE) 500 MG tablet [Pharmacy Med Name: METFORMIN HCL 500 MG TABLET] 90 tablet 1    Sig: TAKE 1 TABLET BY [place]      Endocrinology:  Diabetes - Biguanides Failed - 11/15/2019  8:58 AM      Failed - Cr in normal range and within 360 days    Creat  Date Value Ref Range Status  10/17/2018 1.04 0.70 - 1.18 mg/dL Final    Comment:    For patients >67 years of age, the reference limit for Creatinine is approximately 13% higher for people identified as African-American. .    Creatinine, Ser  Date Value Ref Range Status  10/18/2018 1.02 0.61 - 1.24 mg/dL Final   Creatinine, Urine  Date Value Ref Range Status  10/17/2018 116 20 - 320 mg/dL Final          Failed - HBA1C is between 0 and 7.9 and within 180 days    Hemoglobin A1C  Date Value Ref Range Status  05/15/2019 5.7 (A) 4.0 - 5.6 % Final   Hgb A1c MFr Bld  Date Value Ref Range Status  10/17/2018 6.4 (H) <5.7 % of total Hgb Final    Comment:    For someone without known diabetes, a hemoglobin  A1c value between 5.7% and 6.4% is consistent with prediabetes and should be confirmed with a  follow-up test. . For someone with known diabetes, a value <7% indicates that their diabetes is well controlled. A1c targets should be individualized based on duration of diabetes, age, comorbid conditions, and other considerations. . This assay result is consistent with an increased risk of diabetes. . Currently, no consensus exists regarding use of hemoglobin A1c for diagnosis of diabetes for children. .           Failed - eGFR in normal range and within 360 days     GFR, Est African American  Date Value Ref Range Status  10/17/2018 83 > OR = 60 mL/min/1.66m Final   GFR calc Af Amer  Date Value Ref Range Status  10/18/2018 >60 >60 mL/min Final   GFR, Est Non African American  Date Value Ref Range Status  10/17/2018 72 > OR = 60 mL/min/1.772mFinal   GFR calc non Af Amer  Date Value Ref Range Status  10/18/2018 >60 >60 mL/min Final          Failed - Valid encounter within last 6 months    Recent Outpatient Visits           6 months ago [facility]   1 year ago [facility] with type [facility]  [facility]   1 year ago [facility]ys[facility] with type 2 [facility] ([facility]  [facility]   2 years ago Chronic cough   [place]   2 years ago Dyslipidemia associated with type 2 diabetes mellitus ([facility]  [facility]       Future Appointments  In 3 months Ancil Boozer, Drue Stager, MD [place]

## 2019-11-16 ENCOUNTER — Other Ambulatory Visit: Payer: Self-pay

## 2020-03-04 ENCOUNTER — Other Ambulatory Visit: Payer: Self-pay

## 2020-03-04 ENCOUNTER — Ambulatory Visit (INDEPENDENT_AMBULATORY_CARE_PROVIDER_SITE_OTHER): Payer: BC Managed Care – PPO | Admitting: Family Medicine

## 2020-03-04 ENCOUNTER — Encounter: Payer: Self-pay | Admitting: Family Medicine

## 2020-03-04 VITALS — BP 122/70 | HR 72 | Temp 98.1°F | Resp 15 | Ht 68.0 in | Wt 156.6 lb

## 2020-03-04 DIAGNOSIS — C220 Liver cell carcinoma: Secondary | ICD-10-CM

## 2020-03-04 DIAGNOSIS — I700 Atherosclerosis of aorta: Secondary | ICD-10-CM

## 2020-03-04 DIAGNOSIS — E785 Hyperlipidemia, unspecified: Secondary | ICD-10-CM | POA: Diagnosis not present

## 2020-03-04 DIAGNOSIS — Z5309 Procedure and treatment not carried out because of other contraindication: Secondary | ICD-10-CM

## 2020-03-04 DIAGNOSIS — Z8639 Personal history of other endocrine, nutritional and metabolic disease: Secondary | ICD-10-CM

## 2020-03-04 DIAGNOSIS — E1169 Type 2 diabetes mellitus with other specified complication: Secondary | ICD-10-CM | POA: Diagnosis not present

## 2020-03-04 DIAGNOSIS — Z23 Encounter for immunization: Secondary | ICD-10-CM

## 2020-03-04 DIAGNOSIS — E441 Mild protein-calorie malnutrition: Secondary | ICD-10-CM

## 2020-03-04 DIAGNOSIS — B182 Chronic viral hepatitis C: Secondary | ICD-10-CM

## 2020-03-04 DIAGNOSIS — D649 Anemia, unspecified: Secondary | ICD-10-CM

## 2020-03-04 NOTE — Progress Notes (Signed)
Name: Rodney Moody   MRN: [id number]    DOB: [birth year]   Date:03/04/2020       Progress Note  Subjective  Chief Complaint  Chief Complaint  Patient presents with  . Follow-up    HPI  History of hepatitis C, with liver cancer and metsto mediastinal area: seeing Oncologist at [facility], Dr. Leamon Arnt also radiation oncologist Dr. Donella Stade, hewas on a trial at [facility], but last treatment was April 2020. He denies pain, jaundice or tremors or  confusion, urine is normal color, denies acholic stools He denies nausea or vomiting .He states treatment affected his appetite and weight was 184 lbs in 2017 before hepatic cancer surgery but after that weight has been doing to 155 lbs, he states keeps his follow up at [facility]. Reviewed records, last hepatitis C level was negative, he also had mets to lung and mediastinum but last PET scan looked like scar tissue   Chronic bronchitis:Quit smoking back in 1999 , he has intermittent cough but no wheezing or sob.   DMII: hgbA1C was 7.0% July 2018, but has lost weight, 04/2017 hgbA1C 6.5%,5.9%, 6.4 % down to 5.7 % we decrease dose of Metformin to 500 mg daily on his last visit and we will recheck levels today  [place] home has been well controlled int he low 100's .  He denies polyphagia, polydipsia or polyuria.  He has dyslipidemia but cannot take statin therapy    GERD: taking medication states doing well, no heartburn or indigestion, had repeat EGD 10/2019 , it showed chronic gastritis h. Pylori negative   Hyperthyroidism: he was treated years ago with oral medication, no change in bowel movements or palpitation, we will recheck labs   Dyslipidemia: not currently on medication, hashistory of liver cancer , discussed Zetia since it is not contraindicated but we will recheck labs first   History of prostate cancer: under the care of Dr. Eliberto Ivory, he has been off Myrbetriq  Malnutrition: multifactorial, started to lose weight when on treatment for  cancerthat started in 7867. Baseline weight around 184 lbs, and went down to 150's but is stable now, in the mid 150's, states appetite not the same after liver surgery.   Patient Active Problem List   Diagnosis Date Noted  . Mixed simple and mucopurulent chronic bronchitis ([place]) 10/17/2018  . Hypomagnesemia 03/18/2018  . Atherosclerosis of abdominal aorta ([place]) 01/03/2018  . 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 Vitamin D deficiency 08/30/2017  . H/O malignant neoplasm of prostate 08/30/2017  . Hyperthyroidism without crisis 12/28/2016  . Low back pain 12/16/2015  . Hepatocellular carcinoma ([place]) 08/16/2015  . Elevated liver enzymes 03/18/2015  . GERD (gastroesophageal reflux disease) 11/19/2014  . Dyslipidemia associated with type 2 diabetes mellitus ([place]) 11/19/2014  . Dyslipidemia 11/19/2014  . Hyperlipidemia, unspecified 11/19/2014    Past Surgical History:  Procedure Laterality Date  . COLONOSCOPY W/ BIOPSIES  2014   Patient has polyps-benign  . COLONOSCOPY WITH PROPOFOL N/A 06/04/2016   Procedure: COLONOSCOPY WITH PROPOFOL;  Surgeon: Lollie Sails, MD;  Location: [facility] ENDOSCOPY;  Service: Endoscopy;  Laterality: N/A;  . COLONOSCOPY WITH PROPOFOL N/A 05/16/2018   Procedure: COLONOSCOPY WITH PROPOFOL;  Surgeon: Lollie Sails, MD;  Location: [facility] ENDOSCOPY;  Service: Endoscopy;  Laterality: N/A;  . ESOPHAGOGASTRODUODENOSCOPY N/A 06/04/2016   Procedure: ESOPHAGOGASTRODUODENOSCOPY (EGD);  Surgeon: Lollie Sails, MD;  Location: [facility] ENDOSCOPY;  Service: Endoscopy;  Laterality: N/A;  . laparoscopic resection of liver    . left shoulder    .  PROSTATE BIOPSY      Family History  Problem Relation Age of Onset  . Diabetes Mother   . Hypertension Mother   . Heart disease Brother   . Hypertension Brother   . Hypertension Brother   . Heart attack Brother     Social History   Tobacco Use  . Smoking status: Former Smoker    Packs/day: 0.25     Years: 30.00    Pack years: 7.50    Types: Cigarettes    Quit date: 02/25/1998    Years since quitting: 22.0  . Smokeless tobacco: Never Used  Substance Use Topics  . Alcohol use: No    Alcohol/week: 0.0 standard drinks    Comment: quit in 1999  was not a heavy drinker     Current Outpatient Medications:  .  aspirin 81 MG tablet, Take 81 mg by mouth daily., Disp: , Rfl:  .  clotrimazole-betamethasone (LOTRISONE) cream, APPLY TO AFFECTED & SURROUNDING AREAS OF SKIN 2 TIMES PER DAY MORNING AND EVENING FOR 2 WEEKS, Disp: , Rfl:  .  ferrous sulfate 325 (65 FE) MG tablet, Take by mouth., Disp: , Rfl:  .  pantoprazole (PROTONIX) 40 MG tablet, TAKE 1 TABLET BY MOUTH EVERY DAY, Disp: 90 tablet, Rfl: 1 .  metFORMIN (GLUCOPHAGE) 500 MG tablet, Take 1 tablet (500 mg total) by mouth daily with breakfast., Disp: 90 tablet, Rfl: 1 .  mirabegron ER (MYRBETRIQ) 25 MG TB24 tablet, Take by mouth. (Patient not taking: Reported on 03/04/2020), Disp: , Rfl:   Allergies  Allergen Reactions  . Lisinopril Cough    I personally reviewed active problem list, medication list, allergies, family history, social history, health maintenance with the patient/caregiver today.   ROS  Constitutional: Negative for fever or weight change.  Respiratory: Negative for cough and shortness of breath.   Cardiovascular: Negative for chest pain or palpitations.  Gastrointestinal: Negative for abdominal pain, no bowel changes.  Musculoskeletal: Negative for gait problem or joint swelling.  Skin: Negative for rash.  Neurological: Negative for dizziness or headache.  No other specific complaints in a complete review of systems (except as listed in HPI above).  Objective  Vitals:   03/04/20 1415  BP: 122/70  Pulse: 72  Resp: 15  Temp: 98.1 F (36.7 C)  TempSrc: Oral  SpO2: 100%  Weight: 156 lb 9.6 oz (71 kg)  Height: 5\' 8"  (1.727 m)    Body mass index is 23.81 kg/m.  Physical Exam  Constitutional: Patient  appears well-developed and well-nourished.  No distress.  HEENT: head atraumatic, normocephalic, pupils equal and reactive to light,  neck supple Cardiovascular: Normal rate, regular rhythm and normal heart sounds.  No murmur heard. No BLE edema. Pulmonary/Chest: Effort normal and breath sounds normal. No respiratory distress. Abdominal: Soft.  There is no tenderness. Psychiatric: Patient has a normal mood and affect. behavior is normal. Judgment and thought content normal.   PHQ2/9: Depression screen [facility] 2/9 03/04/2020 05/15/2019 10/17/2018 01/03/2018 09/18/2017  Decreased Interest 3 0 0 0 0  Down, Depressed, Hopeless 0 0 0 0 0  PHQ - 2 Score 3 0 0 0 0  Altered sleeping 0 0 0 2 -  Tired, decreased energy 0 0 0 0 -  Change in appetite 1 0 0 0 -  Feeling bad or failure about yourself  0 0 0 0 -  Trouble concentrating 1 0 0 0 -  Moving slowly or fidgety/restless 0 0 0 0 -  Suicidal thoughts 0 0  0 0 -  PHQ-9 Score 5 0 0 2 -  Difficult doing work/chores Not difficult at all - - Not difficult at all -    phq 9 is positive   Fall Risk: Fall Risk  03/04/2020 05/15/2019 10/17/2018 01/03/2018 11/08/2017  Falls in the past year? 0 0 0 No No  Number falls in past yr: 0 0 0 - -  Injury with Fall? 0 0 0 - -     Functional Status Survey: Is the patient deaf or have difficulty hearing?: No Does the patient have difficulty seeing, even when wearing glasses/contacts?: No Does the patient have difficulty concentrating, remembering, or making decisions?: No Does the patient have difficulty walking or climbing stairs?: No Does the patient have difficulty dressing or bathing?: No Does the patient have difficulty doing errands alone such as visiting a doctor's office or shopping?: No    Assessment & Plan   1. Dyslipidemia associated with type 2 diabetes mellitus (HCC)  - HgB A1c - Urine Microalbumin w/creat. ratio  2. Need for immunization against influenza  - Flu Vaccine QUAD High  Dose(Fluad)  3. Dyslipidemia  - Lipid Profile  4. Atherosclerosis of aorta ([place])  Not allowed to take statin therapy   5. Hepatocellular carcinoma ([place])   6. Chronic hepatitis C without hepatic coma (HCC)   7. History of hyperthyroidism  - TSH  8. Mild protein-calorie malnutrition ([place])  He will try to maintain or gain a few pounds  9. Anemia, unspecified type  Monitored at [facility]  10. Medical contraindication to statin therapy

## 2020-03-04 NOTE — Patient Instructions (Signed)

## 2020-03-05 LAB — MICROALBUMIN / CREATININE URINE RATIO
Creatinine, Urine: 137 mg/dL (ref 20–320)
Microalb Creat Ratio: 3 mcg/mg creat (ref <30)
Microalb, Ur: 0.4 mg/dL

## 2020-03-05 LAB — LIPID PANEL
Cholesterol: 209 mg/dL — ABNORMAL HIGH (ref <200)
HDL: 73 mg/dL (ref >=40)
LDL Cholesterol (Calc): 110 mg/dL (calc) — ABNORMAL HIGH
Non-HDL Cholesterol (Calc): 136 mg/dL (calc) — ABNORMAL HIGH (ref <130)
Total CHOL/HDL Ratio: 2.9 (calc) (ref <5.0)
Triglycerides: 144 mg/dL (ref <150)

## 2020-03-05 LAB — HEMOGLOBIN A1C
Hgb A1c MFr Bld: 6.3 % of total Hgb — ABNORMAL HIGH (ref <5.7)
Mean Plasma Glucose: 134 (calc)
eAG (mmol/L): 7.4 (calc)

## 2020-03-05 LAB — TSH: TSH: 1.39 mIU/L (ref 0.40–4.50)

## 2020-03-21 IMAGING — CT CT ABDOMEN AND PELVIS WITH CONTRAST
2 of 5 series · 16 of 46 positions shown, 18 images · IV contrast (APPLIED)
Comparison: 07/29/2018, PET-CT

CLINICAL DATA: Fever mid abdominal pain.

EXAM:
CT ABDOMEN AND PELVIS WITH CONTRAST
TECHNIQUE: Multidetector CT imaging of the abdomen and pelvis was performed
using the standard protocol following bolus administration of
intravenous contrast.
CONTRAST:  100mL OMNIPAQUE IOHEXOL 300 MG/ML SOLN, 50mL OMNIPAQUE
IOHEXOL 240 MG/ML SOLN

[Series 2: routine abd/pel with · axial · 0.79mm/px · z∈[-808,-378]mm · 13 of 96 slices shown, 15 images]
[im 5/96  soft-tissue]
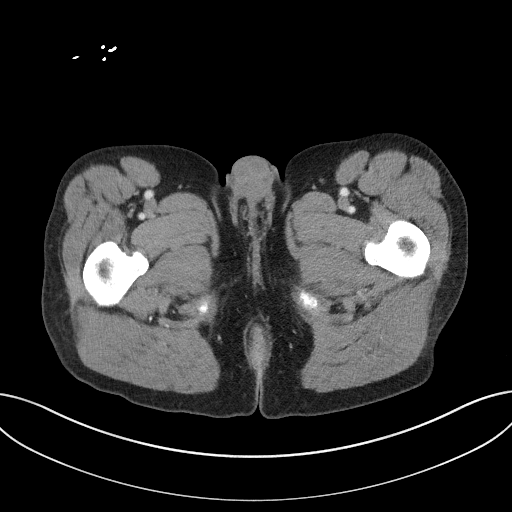
[im 5/96  bone]
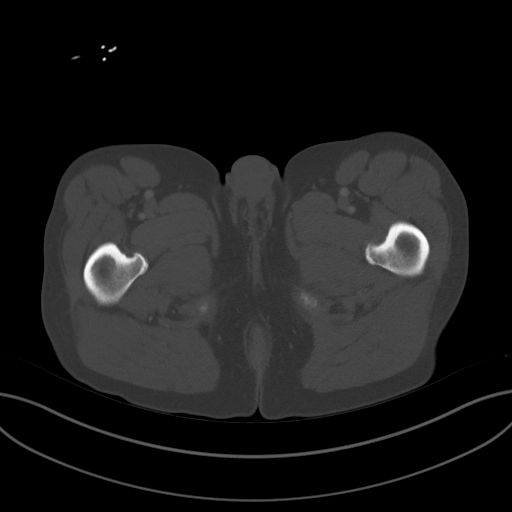
[im 15/96  soft-tissue]
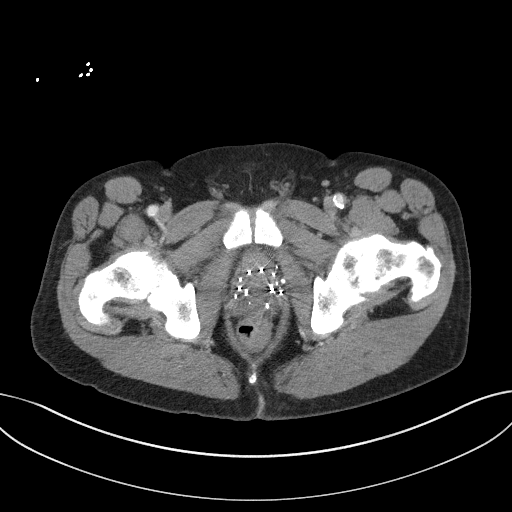
[im 20/96  soft-tissue]
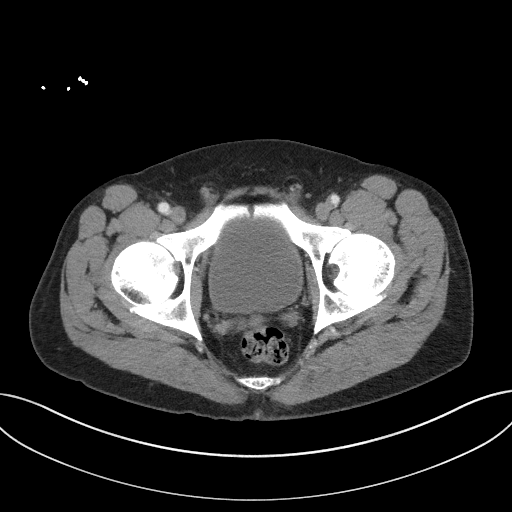
[im 29/96  soft-tissue]
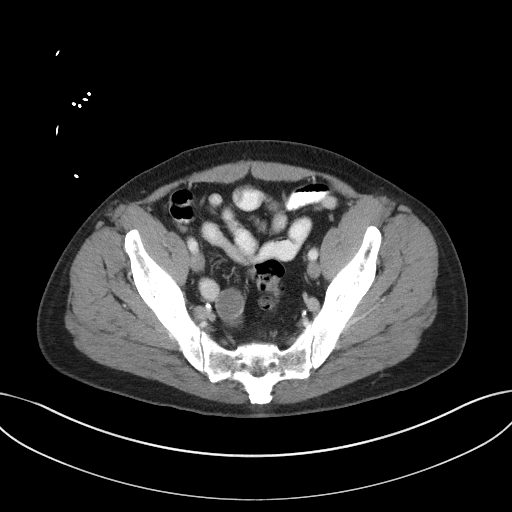
[im 34/96  soft-tissue]
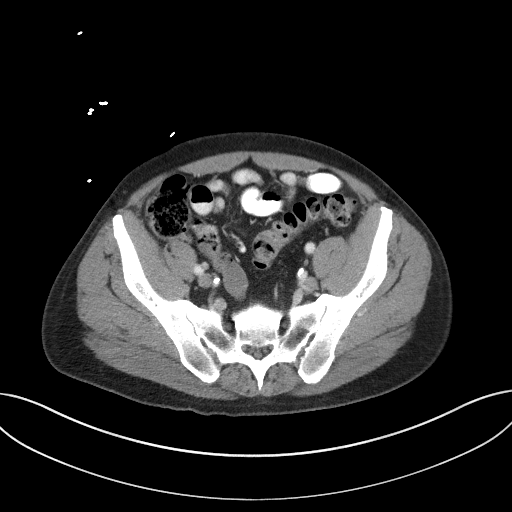
[im 43/96  soft-tissue]
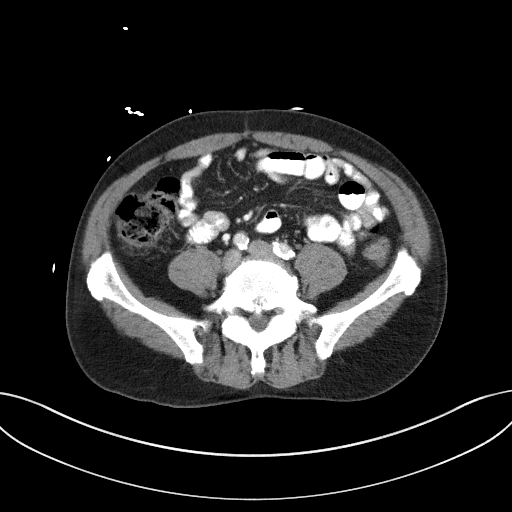
[im 48/96  soft-tissue]
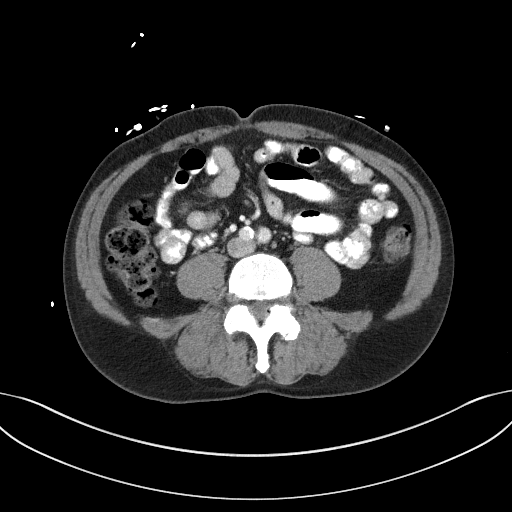
[im 53/96  soft-tissue]
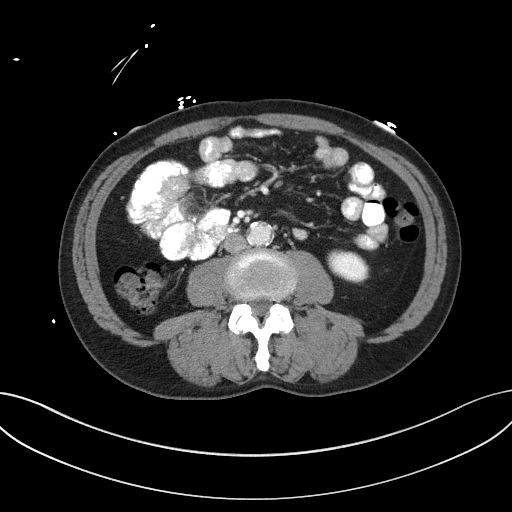
[im 62/96  soft-tissue]
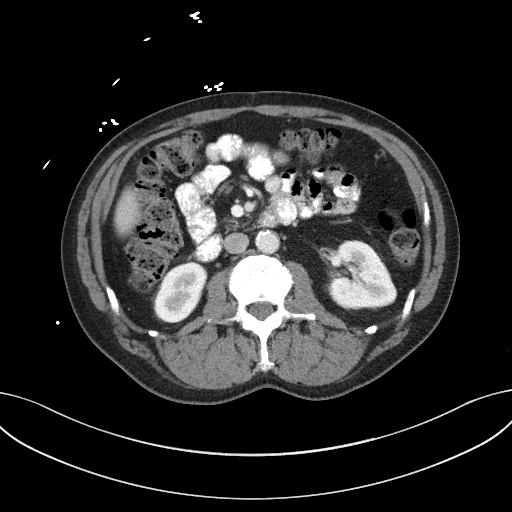
[im 62/96  bone]
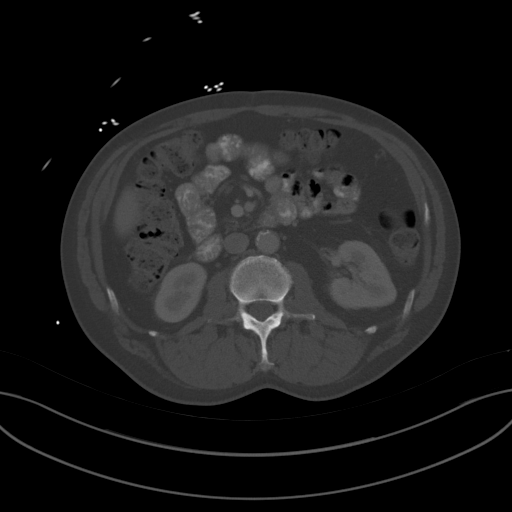
[im 67/96  soft-tissue]
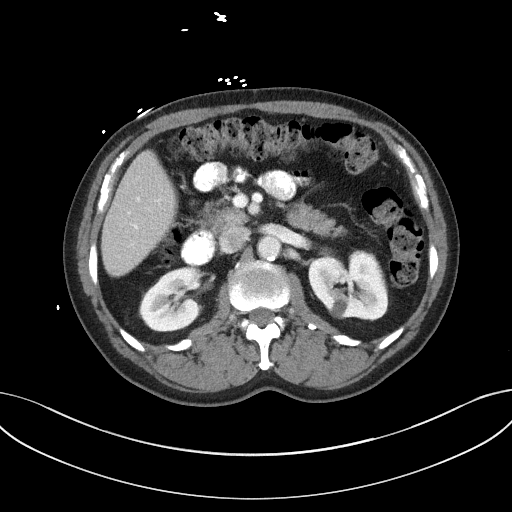
[im 77/96  soft-tissue]
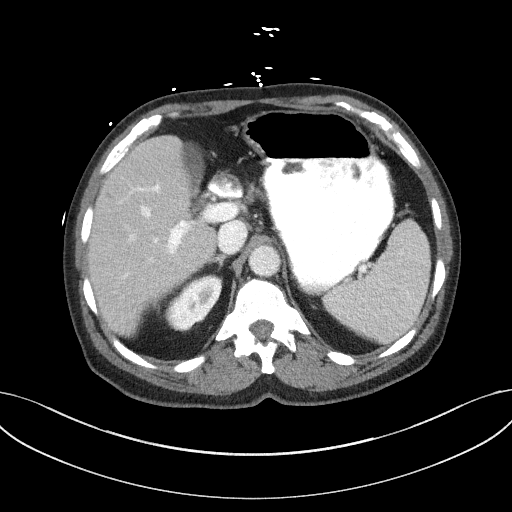
[im 81/96  soft-tissue]
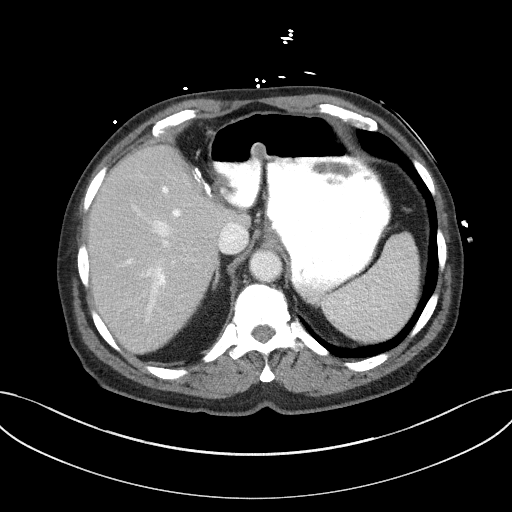
[im 91/96  soft-tissue]
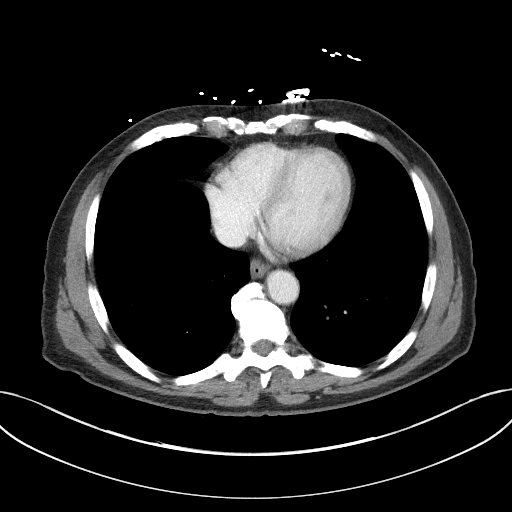

[Series 6: coronal st · coronal · 0.71mm/px · 3 of 89 slices shown]
[im 30/89  soft-tissue]
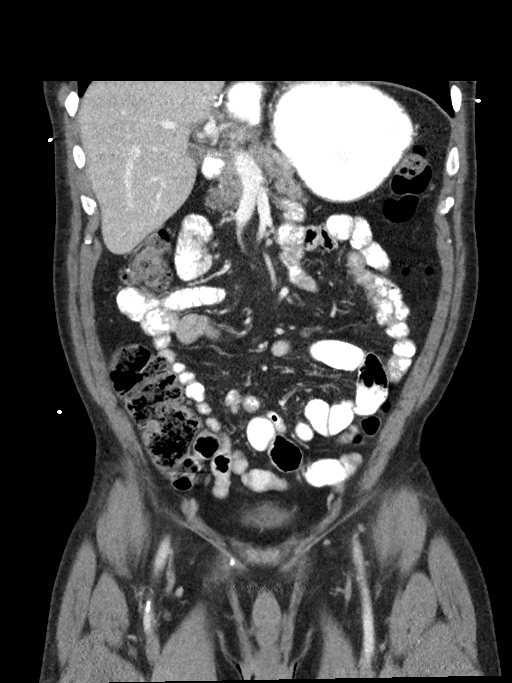
[im 40/89  soft-tissue]
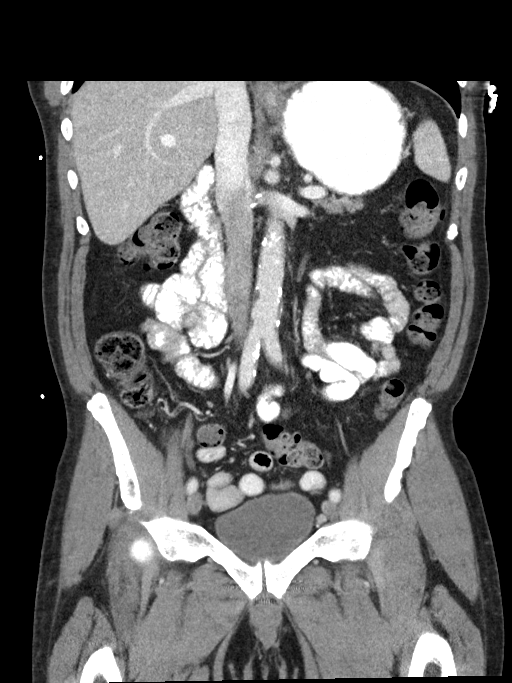
[im 49/89  soft-tissue]
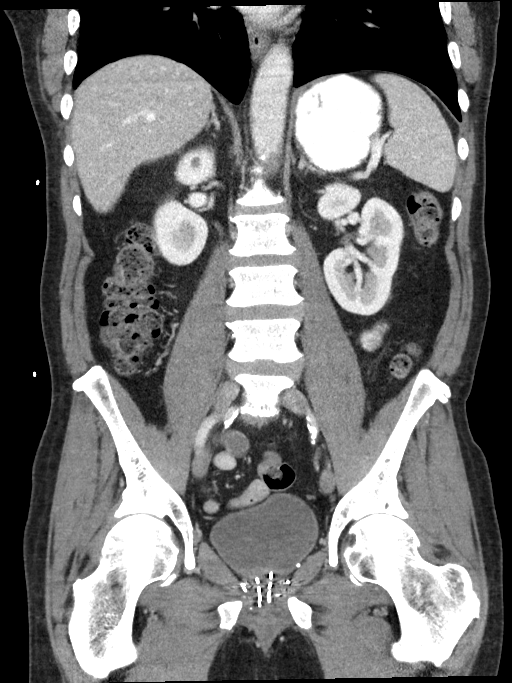

[16 of 46 positions shown; findings below may reference images not displayed]

FINDINGS: Lower chest: No acute abnormality.

Hepatobiliary: Post left hepatectomy. Normal appearance of the
remaining liver parenchyma. Normal appearance of the gallbladder.

Pancreas: Unremarkable. No pancreatic ductal dilatation or
surrounding inflammatory changes.

Spleen: Normal in size without focal abnormality.

Adrenals/Urinary Tract: Normal adrenal glands. Bilateral
circumscribed hypoattenuated renal masses, some of which represent
cysts, some of which too small to be actually characterize by CT.
Normal appearance of the urinary bladder.

Stomach/Bowel: Stomach is within normal limits. Appendix appears
normal. No evidence of bowel wall thickening, distention, or
inflammatory changes. Scattered left colonic diverticulosis. No
evidence of diverticulitis.

Vascular/Lymphatic: Aortic atherosclerosis. No enlarged abdominal or
pelvic lymph nodes.

Reproductive: Radiation seeds within the prostate gland.

Other: No abdominal wall hernia or abnormality. No abdominopelvic
ascites.

Musculoskeletal: Spondylosis of the lumbosacral spine.
IMPRESSION: 1. No evidence of acute abnormality within the abdomen or pelvis.
Stable postsurgical changes from left hepatectomy.
2. Bilateral renal masses, some of which represent cysts, some of
which are too small to be actually characterize by CT.
3. Scattered left colonic diverticulosis without evidence of
diverticulitis.
4. Calcific atherosclerotic disease of the aorta.

## 2020-04-10 ENCOUNTER — Other Ambulatory Visit: Payer: Self-pay | Admitting: Family Medicine

## 2020-04-10 DIAGNOSIS — K219 Gastro-esophageal reflux disease without esophagitis: Secondary | ICD-10-CM

## 2020-04-10 NOTE — Telephone Encounter (Signed)
Requested Prescriptions  Pending Prescriptions Disp Refills  . pantoprazole (PROTONIX) 40 MG tablet [Pharmacy Med Name: PANTOPRAZOLE SOD DR 40 MG TAB] 90 tablet 1    Sig: TAKE 1 TABLET BY MOUTH EVERY DAY     Gastroenterology: Proton Pump Inhibitors Passed - 04/10/2020  9:18 AM      Passed - Valid encounter within last 12 months    Recent Outpatient Visits          1 month ago Dyslipidemia associated with type 2 diabetes mellitus [facility])   [facility] [place], Drue Stager, MD   11 months ago Dyslipidemia associated with type 2 diabetes mellitus [facility])   [facility] [place], Drue Stager, MD   1 year ago Dyslipidemia associated with type 2 diabetes mellitus [facility])   [facility] [place], Drue Stager, MD   2 years ago Dyslipidemia associated with type 2 diabetes mellitus [facility])   [facility] Steele Sizer, MD   2 years ago Chronic cough   [place], NP      Future Appointments            In 4 months Ancil Boozer, Drue Stager, MD [facility], [facility]

## 2020-09-01 NOTE — Progress Notes (Signed)
Name: Rodney Moody   MRN: [id number]    DOB: 03/14/1946   Date:09/02/2020       Progress Note  Subjective  Chief Complaint  Follow Up  HPI  Adenocarcinoma of liver , history of hepatitis C and metsto mediastinal area: seeing Oncologist at [facility], Dr. Leamon Arnt also radiation oncologist Dr. Donella Stade, hewas on a trial at [facility], but last treatment was April 2020. He denies pain, jaundice or tremors or  confusion, urine is normal color, denies acholic stools He denies nausea or vomiting or change in appetite.He states treatment affected his appetite and weight was 184 lbs in 2017 before hepatic cancer surgery but after that weight has been doing to 155 lbs, he is now on yearly follow up. Reviewed records, last hepatitis C level was negative, he also had mets to lung and mediastinum but last PET scan looked like scar tissue , being monitored   Chronic bronchitis:Quit smoking back in 1999 , he has intermittent cough but no wheezing or sob.  Unchanged   DMII: hgbA1C was 7.0% July 2018, but has lost weight, 04/2017 hgbA1C 6.5%,5.9%, 6.4 % 5.7 % we decrease dose of Metformin to 500 mg daily and last visit it was 6.2 %, he states he likes sweets, but explained that if he cuts down on sweets he can skip doses of Metformin and it may help him gain some weight.  Glucoseat home has been well controlled int he low 100's .  He denies polyphagia, polydipsia or polyuria.  He has dyslipidemia but cannot take statin therapy - per patient, advised to ask his oncologist on his next visit     GERD: taking medication states doing well, no heartburn or indigestion, had repeat EGD 10/2019 , it showed chronic gastritis h. Pylori negative  Symptoms controlled   Iron deficiency anemia: takes otc iron supplements, started with liver cancer   Hyperthyroidism: he was treated years ago with oral medication, no change in bowel movements or palpitation, last TSH was normal   Dyslipidemia: not currently on medication,  hashistory of liver cancer , LDL not at goal, advised to ask oncologist about taking medication for cholesterol   History of prostate cancer: under the care of Dr. Eliberto Ivory, he has been off Myrbetriq. Marland Kitchen He has yearly follow ups.   Malnutrition: multifactorial, started to lose weight when on treatment for cancerthat started in 1761. Baseline weight around 184 lbs, and went down to 150's but is stable  in the mid 150's, states appetite not the same after liver surgery, but trying to eat and gain weight, we will try stopping metformin .     Patient Active Problem List   Diagnosis Date Noted  . Mixed simple and mucopurulent chronic bronchitis ([place]) 10/17/2018  . Hypomagnesemia 03/18/2018  . Atherosclerosis of abdominal aorta ([place]) 01/03/2018  . 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 Vitamin D deficiency 08/30/2017  . H/O malignant neoplasm of prostate 08/30/2017  . Hyperthyroidism without crisis 12/28/2016  . Low back pain 12/16/2015  . Hepatocellular carcinoma ([place]) 08/16/2015  . Elevated liver enzymes 03/18/2015  . GERD (gastroesophageal reflux disease) 11/19/2014  . Dyslipidemia associated with type 2 diabetes mellitus ([place]) 11/19/2014  . Dyslipidemia 11/19/2014  . Hyperlipidemia, unspecified 11/19/2014    Past Surgical History:  Procedure Laterality Date  . COLONOSCOPY W/ BIOPSIES  2014   Patient has polyps-benign  . COLONOSCOPY WITH PROPOFOL N/A 06/04/2016   Procedure: COLONOSCOPY WITH PROPOFOL;  Surgeon: Lollie Sails, MD;  Location: [facility] ENDOSCOPY;  Service: Endoscopy;  Laterality: N/A;  . COLONOSCOPY WITH PROPOFOL N/A 05/16/2018   Procedure: COLONOSCOPY WITH PROPOFOL;  Surgeon: Lollie Sails, MD;  Location: [facility] ENDOSCOPY;  Service: Endoscopy;  Laterality: N/A;  . ESOPHAGOGASTRODUODENOSCOPY N/A 06/04/2016   Procedure: ESOPHAGOGASTRODUODENOSCOPY (EGD);  Surgeon: Lollie Sails, MD;  Location: [facility] ENDOSCOPY;  Service: Endoscopy;  Laterality: N/A;   . laparoscopic resection of liver    . left shoulder    . PROSTATE BIOPSY      Family History  Problem Relation Age of Onset  . Diabetes Mother   . Hypertension Mother   . Heart disease Brother   . Hypertension Brother   . Hypertension Brother   . Heart attack Brother     Social History   Tobacco Use  . Smoking status: Former Smoker    Packs/day: 0.25    Years: 30.00    Pack years: 7.50    Types: Cigarettes    Quit date: 02/25/1998    Years since quitting: 22.5  . Smokeless tobacco: Never Used  Substance Use Topics  . Alcohol use: No    Alcohol/week: 0.0 standard drinks    Comment: quit in 1999  was not a heavy drinker     Current Outpatient Medications:  .  aspirin 81 MG tablet, Take 81 mg by mouth daily., Disp: , Rfl:  .  ferrous gluconate (FERGON) 240 (27 FE) MG tablet, Take 240 mg by mouth daily., Disp: , Rfl:  .  pantoprazole (PROTONIX) 40 MG tablet, TAKE 1 TABLET BY MOUTH EVERY DAY, Disp: 90 tablet, Rfl: 1 .  metFORMIN (GLUCOPHAGE) 500 MG tablet, Take 1 tablet (500 mg total) by mouth daily with breakfast. Can skip days if following a diabetic diet, Disp: 90 tablet, Rfl: 0  Allergies  Allergen Reactions  . Lisinopril Cough    I personally reviewed active problem list, medication list, allergies, family history, social history, health maintenance with the patient/caregiver today.   ROS  Constitutional: Negative for fever or weight change.  Respiratory: Negative for cough and shortness of breath.   Cardiovascular: Negative for chest pain or palpitations.  Gastrointestinal: Negative for abdominal pain, no bowel changes.  Musculoskeletal: Negative for gait problem or joint swelling.  Skin: Negative for rash.  Neurological: Negative for dizziness or headache.  No other specific complaints in a complete review of systems (except as listed in HPI above).  Objective  Vitals:   09/02/20 1352  BP: 128/60  Pulse: 63  Resp: 16  Temp: 98.2 F (36.8 C)   TempSrc: Oral  SpO2: 99%  Weight: 156 lb (70.8 kg)  Height: 5\' 8"  (1.727 m)    Body mass index is 23.72 kg/m.  Physical Exam  Constitutional: Patient appears well-developed and very thin.  No distress.  HEENT: head atraumatic, normocephalic, pupils equal and reactive to light,  neck supple Cardiovascular: Normal rate, regular rhythm and normal heart sounds.  No murmur heard. No BLE edema. Pulmonary/Chest: Effort normal and breath sounds normal. No respiratory distress. Abdominal: Soft.  There is no tenderness. Psychiatric: Patient has a normal mood and affect. behavior is normal. Judgment and thought content normal.  Recent Results (from the past 2160 hour(s))  POCT HgB A1C     Status: Abnormal   Collection Time: 09/02/20  2:02 PM  Result Value Ref Range   Hemoglobin A1C 6.2 (A) 4.0 - 5.6 %   HbA1c POC (<> result, manual entry)     HbA1c, POC (prediabetic range)     HbA1c, POC (  controlled diabetic range)      Diabetic Foot Exam: Diabetic Foot Exam - Simple   Simple Foot Form Diabetic Foot exam was performed with the following findings: Yes 09/02/2020  2:30 PM  Visual Inspection See comments: Yes Sensation Testing Intact to touch and monofilament testing bilaterally: Yes Pulse Check Posterior Tibialis and Dorsalis pulse intact bilaterally: Yes Comments Brittle nails      PHQ2/9: Depression screen [facility] 2/9 09/02/2020 03/04/2020 05/15/2019 10/17/2018 01/03/2018  Decreased Interest 1 3 0 0 0  Down, Depressed, Hopeless 0 0 0 0 0  PHQ - 2 Score 1 3 0 0 0  Altered sleeping - 0 0 0 2  Tired, decreased energy - 0 0 0 0  Change in appetite - 1 0 0 0  Feeling bad or failure about yourself  - 0 0 0 0  Trouble concentrating - 1 0 0 0  Moving slowly or fidgety/restless - 0 0 0 0  Suicidal thoughts - 0 0 0 0  PHQ-9 Score - 5 0 0 2  Difficult doing work/chores - Not difficult at all - - Not difficult at all    phq 9 is negative   Fall Risk: Fall Risk  09/02/2020 03/04/2020  05/15/2019 10/17/2018 01/03/2018  Falls in the past year? 0 0 0 0 No  Number falls in past yr: 0 0 0 0 -  Injury with Fall? 0 0 0 0 -      Functional Status Survey: Is the patient deaf or have difficulty hearing?: No Does the patient have difficulty seeing, even when wearing glasses/contacts?: No Does the patient have difficulty concentrating, remembering, or making decisions?: No Does the patient have difficulty walking or climbing stairs?: No Does the patient have difficulty dressing or bathing?: No Does the patient have difficulty doing errands alone such as visiting a doctor's office or shopping?: No    Assessment & Plan  1. Dyslipidemia associated with type 2 diabetes mellitus (HCC)  - POCT HgB A1C - HM Diabetes Foot Exam - metFORMIN (GLUCOPHAGE) 500 MG tablet; Take 1 tablet (500 mg total) by mouth daily with breakfast. Can skip days if following a diabetic diet  Dispense: 90 tablet; Refill: 0  2. Atherosclerosis of aorta (HCC)  Not on statin , states not allowed per oncologist   3. Hepatocellular carcinoma ([place])   4. Dyslipidemia   5. Mild protein-calorie malnutrition ([place])  Discussed stopping Metformin , he states   6. History of hyperthyroidism  TSH has been normal   7. Medical contraindication to statin therapy    8. Iron deficiency anemia, unspecified iron deficiency anemia type   9. History of hepatitis C virus infection   10. Mixed simple and mucopurulent chronic bronchitis (HCC)  Does not want medication

## 2020-09-02 ENCOUNTER — Other Ambulatory Visit: Payer: Self-pay

## 2020-09-02 ENCOUNTER — Ambulatory Visit (INDEPENDENT_AMBULATORY_CARE_PROVIDER_SITE_OTHER): Payer: BC Managed Care – PPO | Admitting: Family Medicine

## 2020-09-02 ENCOUNTER — Encounter: Payer: Self-pay | Admitting: Family Medicine

## 2020-09-02 VITALS — BP 128/60 | HR 63 | Temp 98.2°F | Resp 16 | Ht 68.0 in | Wt 156.0 lb

## 2020-09-02 DIAGNOSIS — E785 Hyperlipidemia, unspecified: Secondary | ICD-10-CM

## 2020-09-02 DIAGNOSIS — C220 Liver cell carcinoma: Secondary | ICD-10-CM | POA: Diagnosis not present

## 2020-09-02 DIAGNOSIS — J418 Mixed simple and mucopurulent chronic bronchitis: Secondary | ICD-10-CM

## 2020-09-02 DIAGNOSIS — E1169 Type 2 diabetes mellitus with other specified complication: Secondary | ICD-10-CM

## 2020-09-02 DIAGNOSIS — I700 Atherosclerosis of aorta: Secondary | ICD-10-CM | POA: Diagnosis not present

## 2020-09-02 DIAGNOSIS — E441 Mild protein-calorie malnutrition: Secondary | ICD-10-CM

## 2020-09-02 DIAGNOSIS — D509 Iron deficiency anemia, unspecified: Secondary | ICD-10-CM

## 2020-09-02 DIAGNOSIS — Z8619 Personal history of other infectious and parasitic diseases: Secondary | ICD-10-CM

## 2020-09-02 DIAGNOSIS — Z5309 Procedure and treatment not carried out because of other contraindication: Secondary | ICD-10-CM

## 2020-09-02 DIAGNOSIS — Z8639 Personal history of other endocrine, nutritional and metabolic disease: Secondary | ICD-10-CM

## 2020-09-02 LAB — POCT GLYCOSYLATED HEMOGLOBIN (HGB A1C): Hemoglobin A1C: 6.2 % — AB (ref 4.0–5.6)

## 2020-09-02 MED ORDER — METFORMIN HCL 500 MG PO TABS: 500.0000 mg | ORAL_TABLET | Freq: Every day | ORAL | 0 refills | Status: DC

## 2020-10-18 ENCOUNTER — Other Ambulatory Visit: Payer: Self-pay | Admitting: Family Medicine

## 2020-10-18 DIAGNOSIS — K219 Gastro-esophageal reflux disease without esophagitis: Secondary | ICD-10-CM

## 2020-11-29 ENCOUNTER — Other Ambulatory Visit: Payer: Self-pay | Admitting: Family Medicine

## 2020-11-29 DIAGNOSIS — E1169 Type 2 diabetes mellitus with other specified complication: Secondary | ICD-10-CM

## 2021-03-09 NOTE — Progress Notes (Signed)
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 Leamon Arnt also radiation oncologist Dr. Donella Stade, he was on a trial at [facility], but last treatment was April 2020. He denies pain, jaundice or tremors or  confusion, urine is normal color, denies acholic stools He denies nausea or vomiting or change in appetite. He states treatment affected his appetite and weight was 184 lbs in 2017 before hepatic cancer surgery he went down gradually but has been in the mid 150's for a while and today is down to 153 lbs, we will stop Metformin and see if he can gain some weight. Reviewed records, last hepatitis C level was negative, he also had mets to lung and mediastinum but last PET scan looked like scar tissue, he was started on iron tablets last year for anemia, and will go back soon for labs and follow up at [facility]. He denies nausea or vomiting, he has intermittent constipation but improves when he eats more fruit    Chronic bronchitis: Quit smoking back in 1999 , he has intermittent cough but no wheezing or sob.  Stable    DMII: hgbA1C was 7.0% July 2018, but has lost weight, 04/2017 hgbA1C 6.5%, 5.9% , 6.4 % 5.7 % we decrease dose of Metformin to 500 mg daily and last visit it was 6.2 % but now he is taking it every other day and A1C is down to 5.8 %  Glucose at home has been in the low 100's  He denies polyphagia, polydipsia or polyuria.  He has dyslipidemia but cannot take statin therapy - per patient, advised to ask his oncologist on his next visit      GERD: taking medication states doing well, no heartburn or indigestion, had repeat EGD 10/2019 , it showed chronic gastritis h. Pylori negative  Symptoms controlled with daily PPI   Iron deficiency anemia: takes otc iron supplements, started with liver cancer , he  will have labs done soon by Oncologist    Hyperthyroidism: he was treated years ago with oral medication, no change in bowel movements or palpitation, TSH has been normal and he will have it [facility] at [facility]   Dyslipidemia/Atherosclerosis of Aorta : not currently on medication, has history of liver cancer, discussed rechecking labs and consider trying low dose statin if okay by oncologist    History of prostate cancer: under the care of Dr. Eliberto Ivory ,no longer having bladder problems and is doing well, reminded him to contact for follow up   Malnutrition: multifactorial, started to lose weight when on treatment for cancer that started in 2017 . Baseline weight around 184 lbs, and went down to 150's he lost another 3 lbs since last visit and we will stop Metformin today .     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 ([place]) 01/03/2018   Vitamin D deficiency 08/30/2017   H/O malignant neoplasm of prostate 08/30/2017   Hyperthyroidism without crisis 12/28/2016   Low back pain 12/16/2015   Hepatocellular carcinoma ([place]) 08/16/2015   Elevated liver enzymes 03/18/2015   GERD (gastroesophageal reflux disease) 11/19/2014  Dyslipidemia associated with type 2 diabetes mellitus ([place]) 11/19/2014   Dyslipidemia 11/19/2014   Hyperlipidemia, unspecified 11/19/2014    Past Surgical History:  Procedure Laterality Date   COLONOSCOPY W/ BIOPSIES  2014   Patient has polyps-benign   COLONOSCOPY WITH PROPOFOL N/A 06/04/2016   Procedure: COLONOSCOPY WITH PROPOFOL;  Surgeon: Lollie Sails, MD;  Location: [facility] ENDOSCOPY;  Service: Endoscopy;  Laterality: N/A;   COLONOSCOPY WITH PROPOFOL N/A 05/16/2018   Procedure: COLONOSCOPY WITH PROPOFOL;  Surgeon: Lollie Sails, MD;  Location: [facility] ENDOSCOPY;  Service:  Endoscopy;  Laterality: N/A;   ESOPHAGOGASTRODUODENOSCOPY N/A 06/04/2016   Procedure: ESOPHAGOGASTRODUODENOSCOPY (EGD);  Surgeon: Lollie Sails, MD;  Location: [facility] ENDOSCOPY;  Service: Endoscopy;  Laterality: N/A;   laparoscopic resection of liver     left shoulder     PROSTATE BIOPSY      Family History  Problem Relation Age of Onset   Diabetes Mother    Hypertension Mother    Heart disease Brother    Hypertension Brother    Hypertension Brother    Heart attack Brother     Social History   Tobacco Use   Smoking status: Former    Packs/day: 0.25    Years: 30.00    Pack years: 7.50    Types: Cigarettes    Quit date: 02/25/1998    Years since quitting: 23.0   Smokeless tobacco: Never  Substance Use Topics   Alcohol use: No    Alcohol/week: 0.0 standard drinks    Comment: quit in 1999  was not a heavy drinker     Current Outpatient Medications:    aspirin 81 MG tablet, Take 81 mg by mouth daily., Disp: , Rfl:    ferrous gluconate (FERGON) 240 (27 FE) MG tablet, Take 240 mg by mouth daily., Disp: , Rfl:    metFORMIN (GLUCOPHAGE) 500 MG tablet, TAKE 1 TABLET BY MOUTH DAILY WITH BREAKFAST. CAN SKIP DAYS IF FOLLOWING A DIABETIC DIET, Disp: 90 tablet, Rfl: 0   pantoprazole (PROTONIX) 40 MG tablet, TAKE 1 TABLET BY MOUTH EVERY DAY, Disp: 90 tablet, Rfl: 1  Allergies  Allergen Reactions   Lisinopril Cough    I personally reviewed active problem list, medication list, allergies, family history, social history, health maintenance with the patient/caregiver today.   ROS  Constitutional: Negative for fever or significant weight change.  Respiratory: Negative for cough and shortness of breath.   Cardiovascular: Negative for chest pain or palpitations.  Gastrointestinal: Negative for abdominal pain, no bowel changes.  Musculoskeletal: Negative for gait problem or joint swelling.  Skin: Negative for rash.  Neurological: Negative for dizziness or headache.  No other  specific complaints in a complete review of systems (except as listed in HPI above).   Objective  Vitals:   03/10/21 1351  BP: 122/72  Pulse: 77  Resp: 16  Temp: 97.9 F (36.6 C)  SpO2: 98%  Weight: 153 lb (69.4 kg)  Height: 5\' 8"  (1.727 m)    Body mass index is 23.26 kg/m.  Physical Exam  Constitutional: Patient appears well-developed and well-nourished.  No distress.  HEENT: head atraumatic, normocephalic, pupils equal and reactive to light, neck supple, Cardiovascular: Normal rate, regular rhythm and normal heart sounds.  No murmur heard. No BLE edema. Pulmonary/Chest: Effort normal and breath sounds normal. No respiratory distress. Abdominal: Soft.  There is no tenderness. Psychiatric: Patient has a normal mood and affect. behavior is normal. Judgment and thought content normal.     PHQ2/9: Depression screen  [facility] 2/9 03/10/2021 09/02/2020 03/04/2020 05/15/2019 10/17/2018  Decreased Interest 0 1 3 0 0  Down, Depressed, Hopeless 0 0 0 0 0  PHQ - 2 Score 0 1 3 0 0  Altered sleeping 0 - 0 0 0  Tired, decreased energy 0 - 0 0 0  Change in appetite 0 - 1 0 0  Feeling bad or failure about yourself  0 - 0 0 0  Trouble concentrating 0 - 1 0 0  Moving slowly or fidgety/restless 0 - 0 0 0  Suicidal thoughts 0 - 0 0 0  PHQ-9 Score 0 - 5 0 0  Difficult doing work/chores - - Not difficult at all - -    phq 9 is negative   Fall Risk: Fall Risk  03/10/2021 09/02/2020 03/04/2020 05/15/2019 10/17/2018  Falls in the past year? 0 0 0 0 0  Number falls in past yr: 0 0 0 0 0  Injury with Fall? 0 0 0 0 0  Risk for fall due to : No Fall Risks - - - -  Follow up Falls prevention discussed - - - -      Assessment & Plan  1. Dyslipidemia associated with type 2 diabetes mellitus ([place])  - POCT HgB A1C - Microalbumin / creatinine urine ratio - Lipid panel  2. Atherosclerosis of aorta (HCC)  - Lipid panel  3. Hepatocellular carcinoma ([place])  Keep follow up at [facility]   4. Mild  protein-calorie malnutrition ([place])  Discussed mid meals snacks.   5. Liver cancer, primary, with metastasis from liver to other site [facility])   6. 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 History of hyperthyroidism   8. Dyslipidemia   9. Iron deficiency anemia, unspecified iron deficiency anemia type   10. History of hepatitis C virus infection   11. Medical contraindication to statin therapy   12. Need for immunization against influenza  - Flu Vaccine QUAD High Dose(Fluad)  13. Gastroesophageal reflux disease without esophagitis  - pantoprazole (PROTONIX) 40 MG tablet; Take 1 tablet (40 mg total) by mouth daily.  Dispense: 90 tablet; Refill: 1

## 2021-03-10 ENCOUNTER — Other Ambulatory Visit: Payer: Self-pay

## 2021-03-10 ENCOUNTER — Ambulatory Visit (INDEPENDENT_AMBULATORY_CARE_PROVIDER_SITE_OTHER): Payer: BC Managed Care – PPO | Admitting: Family Medicine

## 2021-03-10 ENCOUNTER — Encounter: Payer: Self-pay | Admitting: Family Medicine

## 2021-03-10 VITALS — BP 122/72 | HR 77 | Temp 97.9°F | Resp 16 | Ht 68.0 in | Wt 153.0 lb

## 2021-03-10 DIAGNOSIS — I700 Atherosclerosis of aorta: Secondary | ICD-10-CM

## 2021-03-10 DIAGNOSIS — D509 Iron deficiency anemia, unspecified: Secondary | ICD-10-CM

## 2021-03-10 DIAGNOSIS — C7801 Secondary malignant neoplasm of right lung: Secondary | ICD-10-CM

## 2021-03-10 DIAGNOSIS — Z23 Encounter for immunization: Secondary | ICD-10-CM | POA: Diagnosis not present

## 2021-03-10 DIAGNOSIS — E1169 Type 2 diabetes mellitus with other specified complication: Secondary | ICD-10-CM

## 2021-03-10 DIAGNOSIS — E441 Mild protein-calorie malnutrition: Secondary | ICD-10-CM

## 2021-03-10 DIAGNOSIS — Z5309 Procedure and treatment not carried out because of other contraindication: Secondary | ICD-10-CM

## 2021-03-10 DIAGNOSIS — E785 Hyperlipidemia, unspecified: Secondary | ICD-10-CM

## 2021-03-10 DIAGNOSIS — C228 Malignant neoplasm of liver, primary, unspecified as to type: Secondary | ICD-10-CM

## 2021-03-10 DIAGNOSIS — K219 Gastro-esophageal reflux disease without esophagitis: Secondary | ICD-10-CM

## 2021-03-10 DIAGNOSIS — Z8619 Personal history of other infectious and parasitic diseases: Secondary | ICD-10-CM

## 2021-03-10 DIAGNOSIS — C220 Liver cell carcinoma: Secondary | ICD-10-CM | POA: Diagnosis not present

## 2021-03-10 DIAGNOSIS — Z8639 Personal history of other endocrine, nutritional and metabolic disease: Secondary | ICD-10-CM

## 2021-03-10 LAB — POCT GLYCOSYLATED HEMOGLOBIN (HGB A1C): Hemoglobin A1C: 5.8 % — AB (ref 4.0–5.6)

## 2021-03-10 MED ORDER — PANTOPRAZOLE SODIUM 40 MG PO TBEC
40.0000 mg | DELAYED_RELEASE_TABLET | Freq: Every day | ORAL | 1 refills | Status: DC
Start: 2021-03-10 — End: 2021-09-08

## 2021-03-10 NOTE — Patient Instructions (Signed)
Please ask your oncologist if you can take low dose statin therapy. Like Atorvastatin 10 mg or Rosuvastatin 5 mg and let me know the answer.

## 2021-03-11 LAB — LIPID PANEL
Cholesterol: 199 mg/dL (ref <200)
HDL: 78 mg/dL (ref >=40)
LDL Cholesterol (Calc): 107 mg/dL (calc) — ABNORMAL HIGH
Non-HDL Cholesterol (Calc): 121 mg/dL (calc) (ref <130)
Total CHOL/HDL Ratio: 2.6 (calc) (ref <5.0)
Triglycerides: 62 mg/dL (ref <150)

## 2021-03-11 LAB — MICROALBUMIN / CREATININE URINE RATIO
Creatinine, Urine: 75 mg/dL (ref 20–320)
Microalb Creat Ratio: 4 mcg/mg creat (ref <30)
Microalb, Ur: 0.3 mg/dL

## 2021-09-07 NOTE — Progress Notes (Signed)
Name: Rodney Moody   MRN: [id number]    DOB: Jan 16, 1946   Date:09/08/2021 ? ?     Progress Note ? ?Subjective ? ?Chief Complaint ? ?Follow Up ? ?HPI ? 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 Leamon Arnt also radiation oncologist Dr. Donella Stade, he was on a trial at [facility], but last treatment was April 2020. He denies pain, jaundice or tremors or  confusion, urine is normal color, denies acholic stools He denies nausea or vomiting or change in appetite. He states treatment affected his appetite and weight was 184 lbs in 2017 before hepatic cancer surgery he went down gradually but has been in the mid 150's but stable at 153 lbs since last visit.  Reviewed records, last hepatitis C level was negative, he also had mets to lung and mediastinum but last PET scan looked like scar tissue, he was started on iron tablets last year for anemia, and we will recheck labs today.  ?  ?Chronic bronchitis: Quit smoking back in 1999 , he has intermittent cough but no wheezing or sob.  Not on medications  ?  ?DMII: hgbA1C was 7.0% July 2018, but has lost weight, 04/2017 hgbA1C 6.5%, 5.9% , 6.4 % 5.7 % we decrease dose of Metformin to 500 mg daily and last visit it was 6.2 % last visit A1C was down to 5.8 % while taking Metformin every other day , so we stopped medication and A1C is still controlled at 6.3 %   Glucose at home has been in the low 100's  He denies polyphagia, polydipsia or polyuria.  He has dyslipidemia but cannot take statin therapy/per patient    ?  ?GERD: taking medication states doing well, no heartburn or indigestion, had repeat EGD 10/2019 , it showed chronic gastritis h. Pylori negative  Symptoms controlled with daily PPI and he does not  want a lower dose of PPI medication, advised to take half tablets to see if symptoms stays controlled  ? ?Iron deficiency anemia: takes otc iron supplements, started with liver cancer , oncologist advised to take iron  supplementation  ?  ?Hyperthyroidism: he was treated years ago with oral medication, no change in bowel movements or palpitation, TSH has been normal but not checked in over one year.  ?  ?Dyslipidemia/Atherosclerosis of Aorta : not currently on medication, has history of liver cancer, discussed rechecking labs and consider trying low dose statin but he is not interested taking medication at this time  ?  ?History of prostate cancer: under the care of Dr. Eliberto Ivory ,no longer having bladder problems and is doing well ?  ?Malnutrition:  multifactorial, started to lose weight when on treatment for cancer that started in 2017 . Baseline weight around 184 lbs, and went down to 150's but stable over the past 6 months .  He still has temporal waisting  ?  ?Patient Active Problem List  ? Diagnosis Date Noted  ? Simple chronic bronchitis ([place]) 09/08/2021  ? History of hepatitis C virus infection 09/08/2021  ? Medical contraindication to statin therapy 09/08/2021  ? Iron deficiency anemia 09/08/2021  ? History of hyperthyroidism 09/08/2021  ? Liver cancer, primary, with metastasis from liver to other site [facility]) 09/08/2021  ? 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 Thrombocytopenia ([place]) 09/08/2021  ? Mixed simple and mucopurulent chronic bronchitis ([place]) 10/17/2018  ? Hypomagnesemia 03/18/2018  ? Atherosclerosis of aorta ([place]) 01/03/2018  ? 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 Vitamin D deficiency 08/30/2017  ? H/O malignant neoplasm of prostate 08/30/2017  ? Hyperthyroidism without crisis 12/28/2016  ? Low back pain 12/16/2015  ? Hepatocellular carcinoma ([place]) 08/16/2015  ? Elevated liver enzymes 03/18/2015  ? Gastroesophageal reflux disease without esophagitis 11/19/2014  ? Dyslipidemia associated with type 2 diabetes mellitus ([place]) 11/19/2014  ? Dyslipidemia 11/19/2014  ? Hyperlipidemia, unspecified 11/19/2014  ? ? ?Past Surgical History:  ?Procedure Laterality Date  ? COLONOSCOPY  W/ BIOPSIES  2014  ? Patient has polyps-benign  ? COLONOSCOPY WITH PROPOFOL N/A 06/04/2016  ? Procedure: COLONOSCOPY WITH PROPOFOL;  Surgeon: Lollie Sails, MD;  Location: [facility] ENDOSCOPY;  Service: Endoscopy;  Laterality: N/A;  ? COLONOSCOPY WITH PROPOFOL N/A 05/16/2018  ? Procedure: COLONOSCOPY WITH PROPOFOL;  Surgeon: Lollie Sails, MD;  Location[facility] ENDOSCOPY;  Service: Endoscopy;  Laterality: N/A;  ? ESOPHAGOGASTRODUODENOSCOPY N/A 06/04/2016  ? Procedure: ESOPHAGOGASTRODUODENOSCOPY (EGD);  Surgeon: Lollie Sails, MD;  Location: [facility] ENDOSCOPY;  Service: Endoscopy;  Laterality: N/A;  ? laparoscopic resection of liver    ? left shoulder    ? PROSTATE BIOPSY    ? ? ?Family History  ?Problem Relation Age of Onset  ? Diabetes Mother   ? Hypertension Mother   ? Heart disease Brother   ? Hypertension Brother   ? Hypertension Brother   ? Heart attack Brother   ? ? ?Social History  ? ?Tobacco Use  ? Smoking status: Former  ?  Packs/day: 0.25  ?  Years: 30.00  ?  Pack years: 7.50  ?  Types: Cigarettes  ?  Quit date: 02/25/1998  ?  Years since quitting: 23.5  ? Smokeless tobacco: Never  ?Substance Use Topics  ? Alcohol use: No  ?  Alcohol/week: 0.0 standard drinks  ?  Comment: quit in 1999  was not a heavy drinker  ? ? ? ?Current Outpatient Medications:  ?  aspirin 81 MG tablet, Take 81 mg by mouth daily., Disp: , Rfl:  ?  ferrous gluconate (FERGON) 240 (27 FE) MG tablet, Take 240 mg by mouth daily. (Patient not taking: Reported on 09/08/2021), Disp: , Rfl:  ?  pantoprazole (PROTONIX) 40 MG tablet, Take 1 tablet (40 mg total) by mouth daily., Disp: 90 tablet, Rfl: 1 ? ?Allergies  ?Allergen Reactions  ? Lisinopril Cough  ? ? ?I personally reviewed active problem list, medication list, allergies, family history, social history, health maintenance with the patient/caregiver today. ? ? ?ROS ? ?Constitutional: Negative for fever , no significant  weight change since last visit .  ?Respiratory: positive for  mild cough  no  shortness of breath.   ?Cardiovascular: Negative for chest pain or palpitations.  ?Gastrointestinal: Negative for abdominal pain, no bowel changes.  ?Musculoskeletal: Negative for gait problem or joint swelling.  ?Skin: Negative for rash.  ?Neurological: Negative for dizziness or headache.  ?No other specific complaints in a complete review of systems (except as listed in HPI above).  ? ?Objective ? ?Vitals:  ? 09/08/21 1320  ?BP: 110/62  ?Pulse: 68  ?Resp: 16  ?SpO2: 99%  ?Weight: 153 lb (69.4 kg)  ?Height: '5\' 8"'$  (1.727 m)  ? ? ?Body mass index is 23.26 kg/m?. ? ?Physical Exam ? ?Constitutional: Patient appears malnourished with temporal waisting .  No distress.  ?HEENT: head atraumatic, normocephalic, pupils equal and reactive to light, neck supple ?Cardiovascular: Normal rate, regular rhythm and normal heart sounds.  No murmur heard. No BLE edema. ?Pulmonary/Chest: Effort normal  and breath sounds normal. No respiratory distress. ?Abdominal: Soft.  There is no tenderness. ?Psychiatric: Patient has a normal mood and affect. behavior is normal. Judgment and thought content normal.  ? ?Recent Results (from the past 2160 hour(s))  ?POCT HgB A1C     Status: Abnormal  ? Collection Time: 09/08/21  1:28 PM  ?Result Value Ref Range  ? Hemoglobin A1C 6.3 (A) 4.0 - 5.6 %  ? HbA1c POC (<> result, manual entry)    ? HbA1c, POC (prediabetic range)    ? HbA1c, POC (controlled diabetic range)    ? ? ?Diabetic Foot Exam: ?Diabetic Foot Exam - Simple   ?Simple Foot Form ?Visual Inspection ?No deformities, no ulcerations, no other skin breakdown bilaterally: Yes ?Sensation Testing ?Intact to touch and monofilament testing bilaterally: Yes ?Pulse Check ?Posterior Tibialis and Dorsalis pulse intact bilaterally: Yes ?Comments ?  ? ? ? ?PHQ2/9: ? ?  09/08/2021  ?  1:19 PM 03/10/2021  ?  1:50 PM 09/02/2020  ?  1:49 PM 03/04/2020  ?  2:16 PM 05/15/2019  ?  1:34 PM  ?Depression screen PHQ 2/9  ?Decreased Interest 0 0 1 3 0  ?Down,  Depressed, Hopeless 0 0 0 0 0  ?PHQ - 2 Score 0 0 1 3 0  ?Altered sleeping  0  0 0  ?Tired, decreased energy  0  0 0  ?Change in appetite  0  1 0  ?Feeling bad or failure about yourself   0  0 0  ?Trouble concen

## 2021-09-08 ENCOUNTER — Encounter: Payer: Self-pay | Admitting: Family Medicine

## 2021-09-08 ENCOUNTER — Ambulatory Visit (INDEPENDENT_AMBULATORY_CARE_PROVIDER_SITE_OTHER): Payer: BC Managed Care – PPO | Admitting: Family Medicine

## 2021-09-08 VITALS — BP 110/62 | HR 68 | Resp 16 | Ht 68.0 in | Wt 153.0 lb

## 2021-09-08 DIAGNOSIS — Z8619 Personal history of other infectious and parasitic diseases: Secondary | ICD-10-CM

## 2021-09-08 DIAGNOSIS — C781 Secondary malignant neoplasm of mediastinum: Secondary | ICD-10-CM

## 2021-09-08 DIAGNOSIS — K219 Gastro-esophageal reflux disease without esophagitis: Secondary | ICD-10-CM

## 2021-09-08 DIAGNOSIS — I700 Atherosclerosis of aorta: Secondary | ICD-10-CM | POA: Diagnosis not present

## 2021-09-08 DIAGNOSIS — D696 Thrombocytopenia, unspecified: Secondary | ICD-10-CM

## 2021-09-08 DIAGNOSIS — E1169 Type 2 diabetes mellitus with other specified complication: Secondary | ICD-10-CM

## 2021-09-08 DIAGNOSIS — E785 Hyperlipidemia, unspecified: Secondary | ICD-10-CM | POA: Diagnosis not present

## 2021-09-08 DIAGNOSIS — C7801 Secondary malignant neoplasm of right lung: Secondary | ICD-10-CM

## 2021-09-08 DIAGNOSIS — C228 Malignant neoplasm of liver, primary, unspecified as to type: Secondary | ICD-10-CM

## 2021-09-08 DIAGNOSIS — J410 Simple chronic bronchitis: Secondary | ICD-10-CM

## 2021-09-08 DIAGNOSIS — Z8639 Personal history of other endocrine, nutritional and metabolic disease: Secondary | ICD-10-CM

## 2021-09-08 DIAGNOSIS — Z23 Encounter for immunization: Secondary | ICD-10-CM

## 2021-09-08 DIAGNOSIS — D509 Iron deficiency anemia, unspecified: Secondary | ICD-10-CM

## 2021-09-08 DIAGNOSIS — C220 Liver cell carcinoma: Secondary | ICD-10-CM

## 2021-09-08 DIAGNOSIS — Z5309 Procedure and treatment not carried out because of other contraindication: Secondary | ICD-10-CM

## 2021-09-08 LAB — POCT GLYCOSYLATED HEMOGLOBIN (HGB A1C): Hemoglobin A1C: 6.3 % — AB (ref 4.0–5.6)

## 2021-09-08 MED ORDER — PANTOPRAZOLE SODIUM 40 MG PO TBEC: 40.0000 mg | DELAYED_RELEASE_TABLET | Freq: Every day | ORAL | 1 refills | Status: DC

## 2021-09-08 MED ORDER — SHINGRIX 50 MCG/0.5ML IM SUSR: 0.5000 mL | Freq: Once | INTRAMUSCULAR | 0 refills | Status: AC

## 2021-09-08 NOTE — Assessment & Plan Note (Signed)
Monitored at [facility] , s/p resection but had mets  ?

## 2021-09-08 NOTE — Assessment & Plan Note (Signed)
Off medication and controlled  ?

## 2021-09-08 NOTE — Assessment & Plan Note (Signed)
We will recheck TSH ?

## 2021-09-08 NOTE — Assessment & Plan Note (Signed)
Under the care of oncologist 

## 2021-09-08 NOTE — Assessment & Plan Note (Signed)
We will recheck CBC today  ?

## 2021-09-08 NOTE — Assessment & Plan Note (Signed)
Quit smoking in 1999 ? ?

## 2021-09-08 NOTE — Assessment & Plan Note (Signed)
Not on statin therapy due to risk of statins and not interested to take zetia  ?

## 2021-09-09 LAB — COMPLETE METABOLIC PANEL WITH GFR
AG Ratio: 1.4 (calc) (ref 1.0–2.5)
ALT: 13 U/L (ref 9–46)
AST: 23 U/L (ref 10–35)
Albumin: 4.3 g/dL (ref 3.6–5.1)
Alkaline phosphatase (APISO): 61 U/L (ref 35–144)
BUN: 21 mg/dL (ref 7–25)
CO2: 31 mmol/L (ref 20–32)
Calcium: 9.6 mg/dL (ref 8.6–10.3)
Chloride: 103 mmol/L (ref 98–110)
Creat: 1.06 mg/dL (ref 0.70–1.28)
Globulin: 3 g/dL (calc) (ref 1.9–3.7)
Glucose, Bld: 94 mg/dL (ref 65–99)
Potassium: 4.4 mmol/L (ref 3.5–5.3)
Sodium: 139 mmol/L (ref 135–146)
Total Bilirubin: 0.5 mg/dL (ref 0.2–1.2)
Total Protein: 7.3 g/dL (ref 6.1–8.1)
eGFR: 74 mL/min/{1.73_m2} (ref >=60)

## 2021-09-09 LAB — CBC WITH DIFFERENTIAL/PLATELET
Absolute Monocytes: 350 cells/uL (ref 200–950)
Basophils Absolute: 10 cells/uL (ref 0–200)
Basophils Relative: 0.3 %
Eosinophils Absolute: 78 cells/uL (ref 15–500)
Eosinophils Relative: 2.3 %
HCT: 37.4 % — ABNORMAL LOW (ref 38.5–50.0)
Hemoglobin: 12.2 g/dL — ABNORMAL LOW (ref 13.2–17.1)
Lymphs Abs: 1047 cells/uL (ref 850–3900)
MCH: 27.7 pg (ref 27.0–33.0)
MCHC: 32.6 g/dL (ref 32.0–36.0)
MCV: 85 fL (ref 80.0–100.0)
MPV: 10.9 fL (ref 7.5–12.5)
Monocytes Relative: 10.3 %
Neutro Abs: 1914 cells/uL (ref 1500–7800)
Neutrophils Relative %: 56.3 %
Platelets: 170 10*3/uL (ref 140–400)
RBC: 4.4 10*6/uL (ref 4.20–5.80)
RDW: 13.4 % (ref 11.0–15.0)
Total Lymphocyte: 30.8 %
WBC: 3.4 10*3/uL — ABNORMAL LOW (ref 3.8–10.8)

## 2021-09-09 LAB — IRON,TIBC AND FERRITIN PANEL
%SAT: 27 % (calc) (ref 20–48)
Ferritin: 83 ng/mL (ref 24–380)
Iron: 74 ug/dL (ref 50–180)
TIBC: 273 mcg/dL (calc) (ref 250–425)

## 2021-09-09 LAB — TSH: TSH: 1.2 mIU/L (ref 0.40–4.50)

## 2021-09-14 ENCOUNTER — Telehealth: Payer: Self-pay

## 2021-09-14 NOTE — Telephone Encounter (Signed)
Pt given lab results per notes of Dr. Ancil Boozer on 09/12/21. Pt verbalized understanding.

## 2021-09-14 NOTE — Telephone Encounter (Signed)
Patient called on home number, left VM to return the call to the office for lab results. No answer on cell phone, no VM set up.   Steele Sizer, MD  09/12/2021  5:12 PM EDT     White count is slightly low but not concerning, anemia has improved  Iron storage is normal  Normal TSH Sugar, kidney and liver function tests are within normal limits  Continue current medications

## 2021-09-14 NOTE — Telephone Encounter (Signed)
Copied from [place] [id number]. Topic: General - Inquiry >> Sep 13, 2021  4:10 PM Royal Hawthorn, CMA wrote: Reason for CRM: Relay lab results per Dr. Ancil Boozer if patient returns call. Attempted to reach but no answer/no voicemail set up.

## 2021-09-14 NOTE — Telephone Encounter (Signed)
Copied from [place] [id number]. Topic: General - Other >> Sep 14, 2021 10:22 AM Tessa Lerner A wrote: Reason for CRM: The patient has returned a missed call about their lab results   Please contact further when possible

## 2022-03-09 NOTE — Progress Notes (Unsigned)
Name: Rodney Moody   MRN: [id number]    DOB: Apr 26, 1946   Date:03/12/2022       Progress Note  Subjective  Chief Complaint  Follow Up  HPI  Adenocarcinoma of liver , history of hepatitis C ( treated years ago)  and mets to mediastinal area: seeing Oncologist at [facility], Dr. Leamon Arnt also radiation oncologist Dr. Donella Stade, he was on a treatment  trial at [facility], but last treatment was April 2020. He denies pain, jaundice or tremors or  confusion, urine is normal color, denies acholic stools He denies nausea or vomiting or change in appetite, he eats smaller portion since the treatment. He states treatment affected his appetite and weight was 184 lbs in 2017 before hepatic cancer surgery he went down gradually but has been in the mid 150's and has been stable for the past two years. CT abdomen being monitored for pancreatic nodule and small area on liver that has been stable.    Chronic bronchitis: Quit smoking back in 1999 , he states cough has also resolved and denies any  wheezing or sob.     DMII: hgbA1C was 7.0% July 2018, but has lost weight, and now off Metformin and A1C today is at goal at 6.2 %. Glucose at home has been in the low 100's  He denies polyphagia, polydipsia or polyuria.  He has dyslipidemia , his last LFT"s were normal and his oncologist said he can take statins , we will check lipid panel and decide what dose statin we should start him on   GERD: taking medication states doing well, no heartburn or indigestion, had repeat EGD 10/2019 , it showed chronic gastritis h. Pylori negative  Symptoms controlled with daily PPI and he does not  want a lower dose of PPI medication, advised to take half tablets to see if symptoms stays controlled   Iron deficiency anemia: takes otc iron supplements, started with liver cancer , taking iron and we will recheck levels today    Hyperthyroidism: he was treated years ago with oral medication, no change in bowel movements or palpitation, TSH has been  normal   Dyslipidemia/Atherosclerosis of Aorta : not currently on medication, has history of liver cancer, discussed rechecking labs and consider trying low dose statin and he states we can try it again if needed.    History of prostate cancer: used to  see Dr. Eliberto Ivory, no longer having bladder problems and is doing well.   Malnutrition:  multifactorial, started to lose weight when on treatment for cancer that started in 2017 . Baseline weight around 184 lbs, and went down to 150's but stable over the past 2 years    Patient Active Problem List   Diagnosis Date Noted   Simple chronic bronchitis ([place]) 09/08/2021   History of hepatitis C virus infection 09/08/2021   Medical contraindication to statin therapy 09/08/2021   Iron deficiency anemia 09/08/2021   History of hyperthyroidism 09/08/2021   Liver cancer, primary, with metastasis from liver to other site 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[place]) 09/08/2021   Thrombocytopenia ([place]) 09/08/2021   Atherosclerosis of aorta ([place]) 01/03/2018   Adenocarcinoma metastatic to mediastinum ([place]) 01/03/2018   Vitamin D deficiency 08/30/2017   H/O malignant neoplasm of prostate 08/30/2017   Hepatocellular carcinoma ([place]) 08/16/2015   Gastroesophageal reflux disease without esophagitis 11/19/2014   Dyslipidemia associated with type 2 diabetes mellitus ([place]) 11/19/2014   Dyslipidemia 11/19/2014    Past Surgical History:  Procedure Laterality  Date   COLONOSCOPY W/ BIOPSIES  2014   Patient has polyps-benign   COLONOSCOPY WITH PROPOFOL N/A 06/04/2016   Procedure: COLONOSCOPY WITH PROPOFOL;  Surgeon: Lollie Sails, MD;  Location: [facility] ENDOSCOPY;  Service: Endoscopy;  Laterality: N/A;   COLONOSCOPY WITH PROPOFOL N/A 05/16/2018   Procedure: COLONOSCOPY WITH PROPOFOL;  Surgeon: Lollie Sails, MD;  Location[facility] ENDOSCOPY;  Service: Endoscopy;  Laterality: N/A;   ESOPHAGOGASTRODUODENOSCOPY N/A 06/04/2016   Procedure:  ESOPHAGOGASTRODUODENOSCOPY (EGD);  Surgeon: Lollie Sails, MD;  Location[facility] ENDOSCOPY;  Service: Endoscopy;  Laterality: N/A;   laparoscopic resection of liver     left shoulder     PROSTATE BIOPSY      Family History  Problem Relation Age of Onset   Diabetes Mother    Hypertension Mother    Heart disease Brother    Hypertension Brother    Hypertension Brother    Heart attack Brother     Social History   Tobacco Use   Smoking status: Former    Packs/day: 0.25    Years: 30.00    Total pack years: 7.50    Types: Cigarettes    Quit date: 02/25/1998    Years since quitting: 24.0   Smokeless tobacco: Never  Substance Use Topics   Alcohol use: No    Alcohol/week: 0.0 standard drinks of alcohol    Comment: quit in 1999  was not a heavy drinker     Current Outpatient Medications:    aspirin 81 MG tablet, Take 81 mg by mouth daily., Disp: , Rfl:    ferrous gluconate (FERGON) 240 (27 FE) MG tablet, Take 240 mg by mouth daily., Disp: , Rfl:    pantoprazole (PROTONIX) 40 MG tablet, Take 1 tablet (40 mg total) by mouth daily., Disp: 90 tablet, Rfl: 1  Allergies  Allergen Reactions   Lisinopril Cough    I personally reviewed active problem list, medication list, allergies, family history, social history, health maintenance with the patient/caregiver today.   ROS  Constitutional: Negative for fever or weight change.  Respiratory: Negative for cough and shortness of breath.   Cardiovascular: Negative for chest pain or palpitations.  Gastrointestinal: Negative for abdominal pain, no bowel changes.  Musculoskeletal: Negative for gait problem or joint swelling.  Skin: Negative for rash.  Neurological: Negative for dizziness or headache.  No other specific complaints in a complete review of systems (except as listed in HPI above).   Objective  Vitals:   03/12/22 1315  BP: 120/68  Pulse: 61  Resp: 16  SpO2: 100%  Weight: 154 lb (69.9 kg)  Height: '5\' 8"'$  (1.727 m)     Body mass index is 23.42 kg/m.  Physical Exam  Constitutional: Patient appears well-developed and well-nourished.  No distress.  HEENT: head atraumatic, normocephalic, pupils equal and reactive to light, neck supple Cardiovascular: Normal rate, regular rhythm and normal heart sounds.  No murmur heard. No BLE edema. Pulmonary/Chest: Effort normal and breath sounds normal. No respiratory distress. Abdominal: Soft.  There is no tenderness. Normal bowel sounds  Psychiatric: Patient has a normal mood and affect. behavior is normal. Judgment and thought content normal.    PHQ2/9:    03/12/2022    1:16 PM 09/08/2021    1:19 PM 03/10/2021    1:50 PM 09/02/2020    1:49 PM 03/04/2020    2:16 PM  Depression screen PHQ 2/9  Decreased Interest 0 0 0 1 3  Down, Depressed, Hopeless 0 0 0 0 0  PHQ - 2 Score 0 0 0 1 3  Altered sleeping 0  0  0  Tired, decreased energy 0  0  0  Change in appetite 0  0  1  Feeling bad or failure about yourself  0  0  0  Trouble concentrating 0  0  1  Moving slowly or fidgety/restless 0  0  0  Suicidal thoughts 0  0  0  PHQ-9 Score 0  0  5  Difficult doing work/chores     Not difficult at all    phq 9 is negative   Fall Risk:    03/12/2022    1:16 PM 09/08/2021    1:19 PM 03/10/2021    1:49 PM 09/02/2020    1:49 PM 03/04/2020    2:16 PM  Fall Risk   Falls in the past year? 0 0 0 0 0  Number falls in past yr: 0 0 0 0 0  Injury with Fall? 0 0 0 0 0  Risk for fall due to : No Fall Risks No Fall Risks No Fall Risks    Follow up Falls prevention discussed Falls prevention discussed Falls prevention discussed        Functional Status Survey: Is the patient deaf or have difficulty hearing?: No Does the patient have difficulty seeing, even when wearing glasses/contacts?: No Does the patient have difficulty concentrating, remembering, or making decisions?: No Does the patient have difficulty walking or climbing stairs?: No Does the patient have  difficulty dressing or bathing?: No Does the patient have difficulty doing errands alone such as visiting a doctor's office or shopping?: No    Assessment & Plan  1. Dyslipidemia associated with type 2 diabetes mellitus (HCC)  - POCT HgB A1C - Urine Microalbumin w/creat. ratio - COMPLETE METABOLIC PANEL WITH GFR - Lipid panel  2. Simple chronic bronchitis (HCC)  Stable  3. Atherosclerosis of aorta (HCC)  We will recheck lipid panel   4. Hepatocellular carcinoma ([place])  Following up with Dr. Rob Hickman next week  5. Mild protein-calorie malnutrition ([place])  Discussed protein supplementation  6. Need for immunization against influenza  - Flu Vaccine QUAD High Dose(Fluad)  7. Gastroesophageal reflux disease without esophagitis   8. Iron deficiency anemia, unspecified iron deficiency anemia type  - CBC with Differential/Platelet - Iron, TIBC and Ferritin Panel  9. Pancreatic mass  Monitored by [facility]  10. Need for shingles vaccine  - Varicella-zoster vaccine IM   He said he has the same insurance, offered him to contact insurance to verify but he said it should be covered

## 2022-03-12 ENCOUNTER — Encounter: Payer: Self-pay | Admitting: Family Medicine

## 2022-03-12 ENCOUNTER — Ambulatory Visit: Payer: BC Managed Care – PPO | Admitting: Family Medicine

## 2022-03-12 VITALS — BP 120/68 | HR 61 | Resp 16 | Ht 68.0 in | Wt 154.0 lb

## 2022-03-12 DIAGNOSIS — E441 Mild protein-calorie malnutrition: Secondary | ICD-10-CM

## 2022-03-12 DIAGNOSIS — Z23 Encounter for immunization: Secondary | ICD-10-CM

## 2022-03-12 DIAGNOSIS — C220 Liver cell carcinoma: Secondary | ICD-10-CM | POA: Diagnosis not present

## 2022-03-12 DIAGNOSIS — E785 Hyperlipidemia, unspecified: Secondary | ICD-10-CM | POA: Diagnosis not present

## 2022-03-12 DIAGNOSIS — I700 Atherosclerosis of aorta: Secondary | ICD-10-CM

## 2022-03-12 DIAGNOSIS — J410 Simple chronic bronchitis: Secondary | ICD-10-CM

## 2022-03-12 DIAGNOSIS — K219 Gastro-esophageal reflux disease without esophagitis: Secondary | ICD-10-CM

## 2022-03-12 DIAGNOSIS — E1169 Type 2 diabetes mellitus with other specified complication: Secondary | ICD-10-CM | POA: Diagnosis not present

## 2022-03-12 DIAGNOSIS — D509 Iron deficiency anemia, unspecified: Secondary | ICD-10-CM

## 2022-03-12 DIAGNOSIS — K8689 Other specified diseases of pancreas: Secondary | ICD-10-CM

## 2022-03-12 LAB — POCT GLYCOSYLATED HEMOGLOBIN (HGB A1C): Hemoglobin A1C: 6.2 % — AB (ref 4.0–5.6)

## 2022-03-13 LAB — CBC WITH DIFFERENTIAL/PLATELET
Absolute Monocytes: 259 cells/uL (ref 200–950)
Basophils Absolute: 19 cells/uL (ref 0–200)
Basophils Relative: 0.5 %
Eosinophils Absolute: 122 cells/uL (ref 15–500)
Eosinophils Relative: 3.3 %
HCT: 36.8 % — ABNORMAL LOW (ref 38.5–50.0)
Hemoglobin: 12.4 g/dL — ABNORMAL LOW (ref 13.2–17.1)
Lymphs Abs: 1273 cells/uL (ref 850–3900)
MCH: 27.8 pg (ref 27.0–33.0)
MCHC: 33.7 g/dL (ref 32.0–36.0)
MCV: 82.5 fL (ref 80.0–100.0)
MPV: 10.6 fL (ref 7.5–12.5)
Monocytes Relative: 7 %
Neutro Abs: 2028 cells/uL (ref 1500–7800)
Neutrophils Relative %: 54.8 %
Platelets: 173 10*3/uL (ref 140–400)
RBC: 4.46 10*6/uL (ref 4.20–5.80)
RDW: 13.3 % (ref 11.0–15.0)
Total Lymphocyte: 34.4 %
WBC: 3.7 10*3/uL — ABNORMAL LOW (ref 3.8–10.8)

## 2022-03-13 LAB — COMPLETE METABOLIC PANEL WITH GFR
AG Ratio: 1.4 (calc) (ref 1.0–2.5)
ALT: 12 U/L (ref 9–46)
AST: 23 U/L (ref 10–35)
Albumin: 4.3 g/dL (ref 3.6–5.1)
Alkaline phosphatase (APISO): 53 U/L (ref 35–144)
BUN: 17 mg/dL (ref 7–25)
CO2: 31 mmol/L (ref 20–32)
Calcium: 9.5 mg/dL (ref 8.6–10.3)
Chloride: 105 mmol/L (ref 98–110)
Creat: 1.08 mg/dL (ref 0.70–1.28)
Globulin: 3 g/dL (calc) (ref 1.9–3.7)
Glucose, Bld: 82 mg/dL (ref 65–99)
Potassium: 4.1 mmol/L (ref 3.5–5.3)
Sodium: 142 mmol/L (ref 135–146)
Total Bilirubin: 0.3 mg/dL (ref 0.2–1.2)
Total Protein: 7.3 g/dL (ref 6.1–8.1)
eGFR: 72 mL/min/{1.73_m2} (ref >=60)

## 2022-03-13 LAB — LIPID PANEL
Cholesterol: 215 mg/dL — ABNORMAL HIGH (ref <200)
HDL: 69 mg/dL (ref >=40)
LDL Cholesterol (Calc): 121 mg/dL (calc) — ABNORMAL HIGH
Non-HDL Cholesterol (Calc): 146 mg/dL (calc) — ABNORMAL HIGH (ref <130)
Total CHOL/HDL Ratio: 3.1 (calc) (ref <5.0)
Triglycerides: 137 mg/dL (ref <150)

## 2022-03-13 LAB — MICROALBUMIN / CREATININE URINE RATIO
Creatinine, Urine: 154 mg/dL (ref 20–320)
Microalb Creat Ratio: 1 mcg/mg creat (ref <30)
Microalb, Ur: 0.2 mg/dL

## 2022-03-13 LAB — IRON,TIBC AND FERRITIN PANEL
%SAT: 30 % (calc) (ref 20–48)
Ferritin: 92 ng/mL (ref 24–380)
Iron: 82 ug/dL (ref 50–180)
TIBC: 277 mcg/dL (calc) (ref 250–425)

## 2022-03-21 ENCOUNTER — Other Ambulatory Visit: Payer: Self-pay | Admitting: Family Medicine

## 2022-03-21 DIAGNOSIS — K219 Gastro-esophageal reflux disease without esophagitis: Secondary | ICD-10-CM

## 2022-04-22 ENCOUNTER — Emergency Department: Payer: BC Managed Care – PPO

## 2022-04-22 ENCOUNTER — Emergency Department
Admission: EM | Admit: 2022-04-22 | Discharge: 2022-04-22 | Disposition: A | Discharge status: Home / Self Care | Payer: BC Managed Care – PPO | Attending: Emergency Medicine | Admitting: Emergency Medicine

## 2022-04-22 ENCOUNTER — Other Ambulatory Visit: Payer: Self-pay

## 2022-04-22 DIAGNOSIS — Z20822 Contact with and (suspected) exposure to covid-19: Secondary | ICD-10-CM | POA: Insufficient documentation

## 2022-04-22 DIAGNOSIS — E860 Dehydration: Secondary | ICD-10-CM | POA: Insufficient documentation

## 2022-04-22 DIAGNOSIS — J101 Influenza due to other identified influenza virus with other respiratory manifestations: Secondary | ICD-10-CM | POA: Insufficient documentation

## 2022-04-22 DIAGNOSIS — J111 Influenza due to unidentified influenza virus with other respiratory manifestations: Secondary | ICD-10-CM

## 2022-04-22 DIAGNOSIS — R509 Fever, unspecified: Secondary | ICD-10-CM | POA: Diagnosis present

## 2022-04-22 LAB — BASIC METABOLIC PANEL
Anion gap: 7 (ref 5–15)
BUN: 32 mg/dL — ABNORMAL HIGH (ref 8–23)
CO2: 23 mmol/L (ref 22–32)
Calcium: 8.1 mg/dL — ABNORMAL LOW (ref 8.9–10.3)
Chloride: 108 mmol/L (ref 98–111)
Creatinine, Ser: 1.33 mg/dL — ABNORMAL HIGH (ref 0.61–1.24)
GFR, Estimated: 56 mL/min — ABNORMAL LOW (ref >60)
Glucose, Bld: 94 mg/dL (ref 70–99)
Potassium: 4.1 mmol/L (ref 3.5–5.1)
Sodium: 138 mmol/L (ref 135–145)

## 2022-04-22 LAB — CBC WITH DIFFERENTIAL/PLATELET
Abs Immature Granulocytes: 0.01 10*3/uL (ref 0.00–0.07)
Basophils Absolute: 0 10*3/uL (ref 0.0–0.1)
Basophils Relative: 0 %
Eosinophils Absolute: 0 10*3/uL (ref 0.0–0.5)
Eosinophils Relative: 1 %
HCT: 42.3 % (ref 39.0–52.0)
Hemoglobin: 13.4 g/dL (ref 13.0–17.0)
Immature Granulocytes: 0 %
Lymphocytes Relative: 9 %
Lymphs Abs: 0.5 10*3/uL — ABNORMAL LOW (ref 0.7–4.0)
MCH: 27 pg (ref 26.0–34.0)
MCHC: 31.7 g/dL (ref 30.0–36.0)
MCV: 85.3 fL (ref 80.0–100.0)
Monocytes Absolute: 0.6 10*3/uL (ref 0.1–1.0)
Monocytes Relative: 11 %
Neutro Abs: 4.6 10*3/uL (ref 1.7–7.7)
Neutrophils Relative %: 79 %
Platelets: 159 10*3/uL (ref 150–400)
RBC: 4.96 MIL/uL (ref 4.22–5.81)
RDW: 13.5 % (ref 11.5–15.5)
WBC: 5.8 10*3/uL (ref 4.0–10.5)
nRBC: 0 % (ref 0.0–0.2)

## 2022-04-22 LAB — COMPREHENSIVE METABOLIC PANEL
ALT: 17 U/L (ref 0–44)
AST: 35 U/L (ref 15–41)
Albumin: 4.4 g/dL (ref 3.5–5.0)
Alkaline Phosphatase: 49 U/L (ref 38–126)
Anion gap: 9 (ref 5–15)
BUN: 34 mg/dL — ABNORMAL HIGH (ref 8–23)
CO2: 26 mmol/L (ref 22–32)
Calcium: 9.3 mg/dL (ref 8.9–10.3)
Chloride: 102 mmol/L (ref 98–111)
Creatinine, Ser: 1.56 mg/dL — ABNORMAL HIGH (ref 0.61–1.24)
GFR, Estimated: 46 mL/min — ABNORMAL LOW (ref >60)
Glucose, Bld: 104 mg/dL — ABNORMAL HIGH (ref 70–99)
Potassium: 4.4 mmol/L (ref 3.5–5.1)
Sodium: 137 mmol/L (ref 135–145)
Total Bilirubin: 1.6 mg/dL — ABNORMAL HIGH (ref 0.3–1.2)
Total Protein: 8.6 g/dL — ABNORMAL HIGH (ref 6.5–8.1)

## 2022-04-22 LAB — RESP PANEL BY RT-PCR (RSV, FLU A&B, COVID)  RVPGX2
Influenza A by PCR: POSITIVE — AB
Influenza B by PCR: NEGATIVE
Resp Syncytial Virus by PCR: NEGATIVE
SARS Coronavirus 2 by RT PCR: NEGATIVE

## 2022-04-22 MED ORDER — LACTATED RINGERS IV BOLUS
1000.0000 mL | Freq: Once | INTRAVENOUS | Status: AC
  Administered 2022-04-22: 1000 mL via INTRAVENOUS

## 2022-04-22 MED ORDER — OSELTAMIVIR PHOSPHATE 30 MG PO CAPS: 30.0000 mg | ORAL_CAPSULE | Freq: Two times a day (BID) | ORAL | 0 refills | Status: DC

## 2022-04-22 MED ORDER — SODIUM CHLORIDE 0.9 % IV BOLUS
1000.0000 mL | Freq: Once | INTRAVENOUS | Status: AC
  Administered 2022-04-22: 1000 mL via INTRAVENOUS

## 2022-04-22 NOTE — Discharge Instructions (Addendum)
Take the Tamiflu 1 pill twice a day.  We have given you a dose here in the ER.  Make sure you are drinking plenty of fluids for the diarrhea.  Follow-up with your doctor on the 26th or 27th of this month.  Please return if you have a higher fever or get short of breath or feel sicker at all.  If the diarrhea continues please do not hesitate to return for that either. Please have your doctor recheck your renal function and make sure it continues to improve.

## 2022-04-22 NOTE — ED Notes (Signed)
Pt reports body aches and flu like symptoms x 2 days. Flu +

## 2022-04-22 NOTE — ED Provider Notes (Signed)
[facility] Provider Note    Event Date/Time   First MD Initiated Contact with Patient 04/22/22 [phone]     (approximate)   History   Fever   HPI  Rodney Moody is a 76 y.o. male who reports on Thursday he began having a runny nose and has started to feel cold.  He has been having generalized bodyaches and fever since then.  He has a little bit of a cough as well.  He is also been having diarrhea starting last night.  He is gone several times.  He has not gone here in ER.     Physical Exam   Triage Vital Signs: ED Triage Vitals  Enc Vitals Group     BP 04/22/22 0829 110/69     Pulse Rate 04/22/22 0829 70     Resp 04/22/22 0829 17     Temp 04/22/22 0829 98.4 F (36.9 C)     Temp Source 04/22/22 1107 Oral     SpO2 04/22/22 0829 100 %     Weight 04/22/22 0828 155 lb (70.3 kg)     Height 04/22/22 0828 '5\' 8"'$  (1.727 m)     Head Circumference --      Peak Flow --      Pain Score 04/22/22 0828 4     Pain Loc --      Pain Edu? --      Excl. in [place]? --     Most recent vital signs: Vitals:   04/22/22 1107 04/22/22 1227  BP: (!) 103/48 118/60  Pulse: 65   Resp: 20 16  Temp: 98.7 F (37.1 C)   SpO2: 100%      General: Awake, no distress.  CV:  Good peripheral perfusion.  Heart regular rate and rhythm no audible murmurs Resp:  Normal effort.  Lungs are clear Abd:  No distention.  Soft and nontender Extremities: No edema   ED Results / Procedures / Treatments   Labs (all labs ordered are listed, but only abnormal results are displayed) Labs Reviewed  RESP PANEL BY RT-PCR (RSV, FLU A&B, COVID)  RVPGX2 - Abnormal; Notable for the following components:      Result Value   Influenza A by PCR POSITIVE (*)    All other components within normal limits  COMPREHENSIVE METABOLIC PANEL - Abnormal; Notable for the following components:   Glucose, Bld 104 (*)    BUN 34 (*)    Creatinine, Ser 1.56 (*)    Total Protein 8.6 (*)    Total Bilirubin 1.6  (*)    GFR, Estimated 46 (*)    All other components within normal limits  CBC WITH DIFFERENTIAL/PLATELET - Abnormal; Notable for the following components:   Lymphs Abs 0.5 (*)    All other components within normal limits  BASIC METABOLIC PANEL - Abnormal; Notable for the following components:   BUN 32 (*)    Creatinine, Ser 1.33 (*)    Calcium 8.1 (*)    GFR, Estimated 56 (*)    All other components within normal limits     EKG     RADIOLOGY Chest x-ray read by radiologist as right posterior apical changes status post radiation similar to old films.  I reviewed the films and interpreted films and agree.  PROCEDURES:  Critical Care performed:   Procedures   MEDICATIONS ORDERED IN ED: Medications  lactated ringers bolus 1,000 mL (0 mLs Intravenous Stopped 04/22/22 1225)  sodium chloride 0.9 % bolus  1,000 mL (0 mLs Intravenous Stopped 04/22/22 1225)     IMPRESSION / MDM / ASSESSMENT AND PLAN / ED COURSE  I reviewed the triage vital signs and the nursing notes. Patient's initial CMP demonstrates AKI.  This improved after 2 L of fluid and GFR went almost to normal (that is over 60).  Patient will continue p.o. fluids.  Patient will follow-up with his regular doctor early this coming week.  He will take Tamiflu 30 mg twice a day.  Differential diagnosis includes, but is not limited to, influenza with dehydration from that and not drinking for a day as well as having diarrhea.  She had patient's diarrhea has now stopped.  Patient's presentation is most consistent with acute presentation with potential threat to life or bodily function.  The patient is on the cardiac monitor to evaluate for evidence of arrhythmia and/or significant heart rate changes.  None has been seen  I discussed the patient's discharge instructions with him and his wife.  I reviewed the need to drink and follow-up with his doctor to make sure the kidney function gets completely back to normal.   FINAL  CLINICAL IMPRESSION(S) / ED DIAGNOSES   Final diagnoses:  Influenza  Dehydration  I should add the diagnosis should be dehydration to the point of worsening renal function.   Rx / DC Orders   ED Discharge Orders          Ordered    oseltamivir (TAMIFLU) 30 MG capsule  2 times daily        04/22/22 1320             Note:  This document was prepared using Dragon voice recognition software and may include unintentional dictation errors.   Nena Polio, MD 04/22/22 [phone]

## 2022-04-22 NOTE — ED Triage Notes (Signed)
Pt states Thursday he started with a runny nose and then that night he started to feel cold. Pt states fever and general body aches as well.  Pt states coworker has been sick

## 2022-04-24 ENCOUNTER — Ambulatory Visit: Payer: BC Managed Care – PPO | Admitting: Nurse Practitioner

## 2022-04-24 ENCOUNTER — Encounter: Payer: Self-pay | Admitting: Nurse Practitioner

## 2022-04-24 ENCOUNTER — Other Ambulatory Visit: Payer: Self-pay

## 2022-04-24 VITALS — BP 112/68 | HR 70 | Temp 98.3°F | Resp 16 | Ht 68.0 in | Wt 148.1 lb

## 2022-04-24 DIAGNOSIS — N179 Acute kidney failure, unspecified: Secondary | ICD-10-CM | POA: Diagnosis not present

## 2022-04-24 DIAGNOSIS — J101 Influenza due to other identified influenza virus with other respiratory manifestations: Secondary | ICD-10-CM | POA: Diagnosis not present

## 2022-04-24 NOTE — Progress Notes (Signed)
BP 112/68   Pulse 70   Temp 98.3 F (36.8 C) (Oral)   Resp 16   Ht '5\' 8"'$  (1.727 m)   Wt 148 lb 1.6 oz (67.2 kg)   SpO2 95%   BMI 22.52 kg/m    Subjective:    Patient ID: Rodney Moody, male    DOB: [birth year], 76 y.o.   MRN: [id number]  HPI: [id number] Moody is a 76 y.o. male  Chief Complaint  Patient presents with   Influenza    Was seen at [facility] and positive for flu 4 days ago   Dehydration    Urgent would like labs checked for kidney function after being dehydrated   ER follow up/influenza/AKI: patient was seen on 04/22/2022 at [facility] ER.  He presented with runny nose, chills, body aches, fever, cough and diarrhea.   He tested positive for Flu A.  His CMP showed acute kidney injury.  Creatinine 1.56, BUN 34 and GFR 46.  He was given 1 liter of normal saline and 1 liter of lactated ringers.  Patient repeat labs showed creatinine 1.33, BUN 32 and GFR 56. Chest xray showed Dense right apical post radiation changes, similar to prior exams. No new airspace disease. Patient was discharged with Tamiflu 30 mg BID for five days.  Patient reports He is feeling much better.  He is pushing fluids.  Will recheck kidney function.  Recommend continuing to push fluids.   Relevant past medical, surgical, family and social history reviewed and updated as indicated. Interim medical history since our last visit reviewed. Allergies and medications reviewed and updated.  Review of Systems  Constitutional: Negative for fever or weight change.  Respiratory: Negative for cough and shortness of breath.   Cardiovascular: Negative for chest pain or palpitations.  Gastrointestinal: Negative for abdominal pain, no bowel changes.  Musculoskeletal: Negative for gait problem or joint swelling.  Skin: Negative for rash.  Neurological: Negative for dizziness or headache.  No other specific complaints in a complete review of systems (except as listed in HPI above).      Objective:    BP 112/68   Pulse 70    Temp 98.3 F (36.8 C) (Oral)   Resp 16   Ht '5\' 8"'$  (1.727 m)   Wt 148 lb 1.6 oz (67.2 kg)   SpO2 95%   BMI 22.52 kg/m   Wt Readings from Last 3 Encounters:  04/24/22 148 lb 1.6 oz (67.2 kg)  04/22/22 155 lb (70.3 kg)  03/12/22 154 lb (69.9 kg)    Physical Exam Constitutional: Patient appears well-developed and well-nourished. No distress.  HEENT: head atraumatic, normocephalic, pupils equal and reactive to light, neck supple Cardiovascular: Normal rate, regular rhythm and normal heart sounds.  No murmur heard. No BLE edema. Pulmonary/Chest: Effort normal and breath sounds normal. No respiratory distress. Abdominal: Soft.  There is no tenderness. Psychiatric: Patient has a normal mood and affect. behavior is normal. Judgment and thought content normal.  Results for orders placed or performed during the hospital encounter of 04/22/22  Resp panel by RT-PCR (RSV, Flu A&B, Covid) Anterior Nasal Swab   Specimen: Anterior Nasal Swab  Result Value Ref Range   SARS Coronavirus 2 by RT PCR NEGATIVE NEGATIVE   Influenza A by PCR POSITIVE (A) NEGATIVE   Influenza B by PCR NEGATIVE NEGATIVE   Resp Syncytial Virus by PCR NEGATIVE NEGATIVE  Comprehensive metabolic panel  Result Value Ref Range   Sodium 137 135 - 145 mmol/L  Potassium 4.4 3.5 - 5.1 mmol/L   Chloride 102 98 - 111 mmol/L   CO2 26 22 - 32 mmol/L   Glucose, Bld 104 (H) 70 - 99 mg/dL   BUN 34 (H) 8 - 23 mg/dL   Creatinine, Ser 1.56 (H) 0.61 - 1.24 mg/dL   Calcium 9.3 8.9 - 10.3 mg/dL   Total Protein 8.6 (H) 6.5 - 8.1 g/dL   Albumin 4.4 3.5 - 5.0 g/dL   AST 35 15 - 41 U/L   ALT 17 0 - 44 U/L   Alkaline Phosphatase 49 38 - 126 U/L   Total Bilirubin 1.6 (H) 0.3 - 1.2 mg/dL   GFR, Estimated 46 (L) >60 mL/min   Anion gap 9 5 - 15  CBC with Differential  Result Value Ref Range   WBC 5.8 4.0 - 10.5 K/uL   RBC 4.96 4.22 - 5.81 MIL/uL   Hemoglobin 13.4 13.0 - 17.0 g/dL   HCT 42.3 39.0 - 52.0 %   MCV 85.3 80.0 - 100.0 fL   MCH  27.0 26.0 - 34.0 pg   MCHC 31.7 30.0 - 36.0 g/dL   RDW 13.5 11.5 - 15.5 %   Platelets 159 150 - 400 K/uL   nRBC 0.0 0.0 - 0.2 %   Neutrophils Relative % 79 %   Neutro Abs 4.6 1.7 - 7.7 K/uL   Lymphocytes Relative 9 %   Lymphs Abs 0.5 (L) 0.7 - 4.0 K/uL   Monocytes Relative 11 %   Monocytes Absolute 0.6 0.1 - 1.0 K/uL   Eosinophils Relative 1 %   Eosinophils Absolute 0.0 0.0 - 0.5 K/uL   Basophils Relative 0 %   Basophils Absolute 0.0 0.0 - 0.1 K/uL   Immature Granulocytes 0 %   Abs Immature Granulocytes 0.01 0.00 - 0.07 K/uL  Basic metabolic panel  Result Value Ref Range   Sodium 138 135 - 145 mmol/L   Potassium 4.1 3.5 - 5.1 mmol/L   Chloride 108 98 - 111 mmol/L   CO2 23 22 - 32 mmol/L   Glucose, Bld 94 70 - 99 mg/dL   BUN 32 (H) 8 - 23 mg/dL   Creatinine, Ser 1.33 (H) 0.61 - 1.24 mg/dL   Calcium 8.1 (L) 8.9 - 10.3 mg/dL   GFR, Estimated 56 (L) >60 mL/min   Anion gap 7 5 - 15      Assessment & Plan:   Problem List Items Addressed This Visit   None Visit Diagnoses     Influenza A    -  Primary   patient reports feeling better.  continue to push fluids and rechecking labs   Relevant Orders   BASIC METABOLIC PANEL WITH GFR   Acute kidney injury [facility])       patient reports feeling better. continue to push fluids and rechecking labs   Relevant Orders   BASIC METABOLIC PANEL WITH GFR        Follow up plan: No follow-ups on file.

## 2022-04-25 LAB — BASIC METABOLIC PANEL WITH GFR
BUN: 12 mg/dL (ref 7–25)
CO2: 30 mmol/L (ref 20–32)
Calcium: 9.2 mg/dL (ref 8.6–10.3)
Chloride: 105 mmol/L (ref 98–110)
Creat: 0.89 mg/dL (ref 0.70–1.28)
Glucose, Bld: 92 mg/dL (ref 65–99)
Potassium: 3.6 mmol/L (ref 3.5–5.3)
Sodium: 142 mmol/L (ref 135–146)
eGFR: 89 mL/min/{1.73_m2} (ref >=60)

## 2022-08-16 ENCOUNTER — Other Ambulatory Visit: Payer: Self-pay | Admitting: Family Medicine

## 2022-08-16 DIAGNOSIS — K219 Gastro-esophageal reflux disease without esophagitis: Secondary | ICD-10-CM

## 2022-09-10 ENCOUNTER — Ambulatory Visit: Payer: BC Managed Care – PPO | Admitting: Family Medicine

## 2022-09-20 NOTE — Progress Notes (Signed)
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 Lattie Corns also radiation oncologist Dr. Aggie Cosier, he was on a treatment  trial at [facility], but last treatment was April 2020. He denies pain, jaundice or tremors or  confusion, urine is normal color, denies acholic stools He denies nausea or vomiting or change in appetite, he eats smaller portion since the treatment. He states treatment affected his appetite and weight was 184 lbs in 2017 before hepatic cancer surgery he went down gradually it was int he 150 lbs range but now is down to higher 140's . CT abdomen being monitored for pancreatic nodule and small area on liver that has been stable. Last CT was done 02/2022    Chronic bronchitis: Quit smoking back in 1999 , he states cough has also resolved and denies any  wheezing or sob.  Doing well    DMII: hgbA1C was 7.0% July 2018, but he lost weight and was having hypoglycemic episodes so we stopped Metformin a couple of years ago and A1C has been controlled at 6.2 % . Glucose at home has been in the low 100's  He denies polyphagia, polydipsia or polyuria.  He has dyslipidemia , his last LFT"s were normal and his oncologist said he can take statins , we will start him on Rosuvastatin 5 mg today    GERD: taking medication states doing well, no heartburn or indigestion, had repeat EGD 10/2019 , it showed chronic gastritis h. Pylori negative  Symptoms controlled with daily PPI and he does not  want a lower dose of PPI medication, we had discussed going down to half dose but he is not willing to do it at this time   Iron deficiency anemia: takes otc iron supplements, started with liver cancer , taking iron and last CBC was normal at [facility]     Hyperthyroidism: he was treated years ago with  oral medication, no change in bowel movements or palpitation, last TSH was normal    Dyslipidemia/Atherosclerosis of Aorta : reviewed labs we will start him on Rosuvastatin 5 mg daily he already takes aspirin 81 mg daily    History of prostate cancer: used to  see Dr. Sheppard Penton, no longer having bladder problems and is doing well.   Malnutrition:  multifactorial, started to lose weight when on treatment for cancer that started in 2017 . 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   Thrombocytopenia (HCC) 09/08/2021   Mild protein-calorie malnutrition (HCC) 10/17/2018   Atherosclerosis of aorta (  HCC) 01/03/2018   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) 01/03/2018   Vitamin D deficiency 08/30/2017   H/O malignant neoplasm of prostate 08/30/2017   Hepatocellular carcinoma (HCC) 08/16/2015   Gastroesophageal reflux disease without esophagitis 11/19/2014   Dyslipidemia associated with type 2 diabetes mellitus (HCC) 11/19/2014   Dyslipidemia 11/19/2014    Past Surgical History:  Procedure Laterality Date   COLONOSCOPY W/ BIOPSIES  2014   Patient has polyps-benign   COLONOSCOPY WITH PROPOFOL N/A 06/04/2016   Procedure: COLONOSCOPY WITH PROPOFOL;  Surgeon: Christena Deem, MD;  Location: [facility] ENDOSCOPY;  Service: Endoscopy;   Laterality: N/A;   COLONOSCOPY WITH PROPOFOL N/A 05/16/2018   Procedure: COLONOSCOPY WITH PROPOFOL;  Surgeon: Christena Deem, MD;  Location: [facility] ENDOSCOPY;  Service: Endoscopy;  Laterality: N/A;   ESOPHAGOGASTRODUODENOSCOPY N/A 06/04/2016   Procedure: ESOPHAGOGASTRODUODENOSCOPY (EGD);  Surgeon: Christena Deem, MD;  Location: [facility] ENDOSCOPY;  Service: Endoscopy;  Laterality: N/A;   laparoscopic resection of liver     left shoulder     PROSTATE BIOPSY      Family History  Problem Relation Age of Onset   Diabetes Mother    Hypertension Mother    Heart disease Brother    Hypertension Brother    Hypertension Brother    Heart attack Brother     Social History   Tobacco Use   Smoking status: Former    Packs/day: 0.25    Years: 30.00    Additional pack years: 0.00    Total pack years: 7.50    Types: Cigarettes    Quit date: 02/25/1998    Years since quitting: 24.5   Smokeless tobacco: Never  Substance Use Topics   Alcohol use: No    Alcohol/week: 0.0 standard drinks of alcohol    Comment: quit in 1999  was not a heavy drinker     Current Outpatient Medications:    aspirin 81 MG tablet, Take 81 mg by mouth daily., Disp: , Rfl:    ferrous gluconate (FERGON) 240 (27 FE) MG tablet, Take 240 mg by mouth daily., Disp: , Rfl:    pantoprazole (PROTONIX) 40 MG tablet, TAKE 1 TABLET BY MOUTH EVERY DAY, Disp: 90 tablet, Rfl: 1   rosuvastatin (CRESTOR) 5 MG tablet, Take 1 tablet (5 mg total) by mouth daily., Disp: 90 tablet, Rfl: 1   SODIUM FLUORIDE 5000 SENSITIVE 1.1-5 % GEL, See admin instructions. (Patient not taking: Reported on 09/21/2022), Disp: , Rfl:   Allergies  Allergen Reactions   Lisinopril Cough    I personally reviewed active problem list, medication list, allergies, family history, social history, health maintenance with the patient/caregiver today.   ROS  Constitutional: Negative for fever or significant  weight change.  Respiratory: Negative for cough and  shortness of breath.   Cardiovascular: Negative for chest pain or palpitations.  Gastrointestinal: Negative for abdominal pain, no bowel changes.  Musculoskeletal: Negative for gait problem or joint swelling.  Skin: Negative for rash.  Neurological: Negative for dizziness or headache.  No other specific complaints in a complete review of systems (except as listed in HPI above).   Objective  Vitals:   09/21/22 0831  BP: 114/64  Pulse: (!) 56  Resp: 16  Temp: 97.6 F (36.4 C)  TempSrc: Oral  SpO2: 96%  Weight: 148 lb 12.8 oz (67.5 kg)  Height: 5\' 8"  (1.727 m)    Body mass index is 22.62 kg/m.  Physical Exam  Constitutional: Patient appears well-developed with temporal waisting No distress.  HEENT:  head atraumatic, normocephalic, pupils equal and reactive to light, neck supple Cardiovascular: Normal rate, regular rhythm and normal heart sounds.  No murmur heard. No BLE edema. Pulmonary/Chest: Effort normal and breath sounds normal. No respiratory distress. Abdominal: Soft.  There is no tenderness. Psychiatric: Patient has a normal mood and affect. behavior is normal. Judgment and thought content normal.   Recent Results (from the past 2160 hour(s))  POCT HgB A1C     Status: Abnormal   Collection Time: 09/21/22  8:36 AM  Result Value Ref Range   Hemoglobin A1C 6.2 (A) 4.0 - 5.6 %   HbA1c POC (<> result, manual entry)     HbA1c, POC (prediabetic range)     HbA1c, POC (controlled diabetic range)      Diabetic Foot Exam: Diabetic Foot Exam - Simple   Simple Foot Form Visual Inspection No deformities, no ulcerations, no other skin breakdown bilaterally: Yes Sensation Testing Intact to touch and monofilament testing bilaterally: Yes Pulse Check Posterior Tibialis and Dorsalis pulse intact bilaterally: Yes Comments     PHQ2/9:    09/21/2022    8:36 AM 04/24/2022    2:50 PM 03/12/2022    1:16 PM 09/08/2021    1:19 PM 03/10/2021    1:50 PM  Depression screen PHQ  2/9  Decreased Interest 3 0 0 0 0  Down, Depressed, Hopeless 0 0 0 0 0  PHQ - 2 Score 3 0 0 0 0  Altered sleeping 0  0  0  Tired, decreased energy 2  0  0  Change in appetite 1  0  0  Feeling bad or failure about yourself  0  0  0  Trouble concentrating 0  0  0  Moving slowly or fidgety/restless 0  0  0  Suicidal thoughts 0  0  0  PHQ-9 Score 6  0  0  Difficult doing work/chores Not difficult at all        phq 9 is positive   Fall Risk:    09/21/2022    8:33 AM 04/24/2022    2:50 PM 03/12/2022    1:16 PM 09/08/2021    1:19 PM 03/10/2021    1:49 PM  Fall Risk   Falls in the past year? 1 0 0 0 0  Number falls in past yr: 0 0 0 0 0  Injury with Fall? 1 0 0 0 0  Risk for fall due to : History of fall(s)  No Fall Risks No Fall Risks No Fall Risks  Follow up Falls evaluation completed;Education provided;Falls prevention discussed Falls evaluation completed Falls prevention discussed Falls prevention discussed Falls prevention discussed     Functional Status Survey: Is the patient deaf or have difficulty hearing?: No Does the patient have difficulty seeing, even when wearing glasses/contacts?: No Does the patient have difficulty concentrating, remembering, or making decisions?: No Does the patient have difficulty walking or climbing stairs?: No Does the patient have difficulty dressing or bathing?: No Does the patient have difficulty doing errands alone such as visiting a doctor's office or shopping?: No    Assessment & Plan  1. Dyslipidemia associated with type 2 diabetes mellitus (HCC)  - POCT HgB A1C - HM Diabetes Foot Exam - rosuvastatin (CRESTOR) 5 MG tablet; Take 1 tablet (5 mg total) by mouth daily.  Dispense: 90 tablet; Refill: 1  2. Mild protein-calorie malnutrition (HCC)  Discussed adding snacks   3. Simple chronic bronchitis (HCC)  Stable   4. Atherosclerosis of aorta (  HCC)  Starting statin therapy and continue aspirin  5. Hepatocellular carcinoma  (HCC)  Under the care of oncologist at [facility]   6. Gastroesophageal reflux disease without esophagitis  Controlled on PPI   7. Pancreatic mass  Stable   8. Iron deficiency anemia, unspecified iron deficiency anemia type  Still taking supplements   9. History of hyperthyroidism

## 2022-09-21 ENCOUNTER — Encounter: Payer: Self-pay | Admitting: Family Medicine

## 2022-09-21 ENCOUNTER — Ambulatory Visit: Payer: BC Managed Care – PPO | Admitting: Family Medicine

## 2022-09-21 VITALS — BP 114/64 | HR 56 | Temp 97.6°F | Resp 16 | Ht 68.0 in | Wt 148.8 lb

## 2022-09-21 DIAGNOSIS — E1169 Type 2 diabetes mellitus with other specified complication: Secondary | ICD-10-CM

## 2022-09-21 DIAGNOSIS — K8689 Other specified diseases of pancreas: Secondary | ICD-10-CM

## 2022-09-21 DIAGNOSIS — C220 Liver cell carcinoma: Secondary | ICD-10-CM

## 2022-09-21 DIAGNOSIS — E785 Hyperlipidemia, unspecified: Secondary | ICD-10-CM | POA: Diagnosis not present

## 2022-09-21 DIAGNOSIS — F341 Dysthymic disorder: Secondary | ICD-10-CM

## 2022-09-21 DIAGNOSIS — J410 Simple chronic bronchitis: Secondary | ICD-10-CM | POA: Diagnosis not present

## 2022-09-21 DIAGNOSIS — I700 Atherosclerosis of aorta: Secondary | ICD-10-CM | POA: Diagnosis not present

## 2022-09-21 DIAGNOSIS — Z7984 Long term (current) use of oral hypoglycemic drugs: Secondary | ICD-10-CM | POA: Diagnosis not present

## 2022-09-21 DIAGNOSIS — Z8639 Personal history of other endocrine, nutritional and metabolic disease: Secondary | ICD-10-CM

## 2022-09-21 DIAGNOSIS — D509 Iron deficiency anemia, unspecified: Secondary | ICD-10-CM

## 2022-09-21 DIAGNOSIS — K219 Gastro-esophageal reflux disease without esophagitis: Secondary | ICD-10-CM

## 2022-09-21 DIAGNOSIS — E441 Mild protein-calorie malnutrition: Secondary | ICD-10-CM | POA: Diagnosis not present

## 2022-09-21 LAB — POCT GLYCOSYLATED HEMOGLOBIN (HGB A1C): Hemoglobin A1C: 6.2 % — AB (ref 4.0–5.6)

## 2022-09-21 MED ORDER — ROSUVASTATIN CALCIUM 5 MG PO TABS: 5.0000 mg | ORAL_TABLET | Freq: Every day | ORAL | 1 refills | Status: DC

## 2023-03-15 ENCOUNTER — Other Ambulatory Visit: Payer: Self-pay | Admitting: Family Medicine

## 2023-03-15 DIAGNOSIS — E1169 Type 2 diabetes mellitus with other specified complication: Secondary | ICD-10-CM

## 2023-05-24 ENCOUNTER — Emergency Department: Payer: Medicare Other

## 2023-05-24 ENCOUNTER — Emergency Department
Admission: EM | Admit: 2023-05-24 | Discharge: 2023-05-24 | Disposition: A | Discharge status: Home / Self Care | Payer: Medicare Other | Attending: Emergency Medicine | Admitting: Emergency Medicine

## 2023-05-24 ENCOUNTER — Other Ambulatory Visit: Payer: Self-pay

## 2023-05-24 DIAGNOSIS — E119 Type 2 diabetes mellitus without complications: Secondary | ICD-10-CM | POA: Insufficient documentation

## 2023-05-24 DIAGNOSIS — N5082 Scrotal pain: Secondary | ICD-10-CM | POA: Diagnosis present

## 2023-05-24 DIAGNOSIS — N451 Epididymitis: Secondary | ICD-10-CM | POA: Insufficient documentation

## 2023-05-24 LAB — URINALYSIS, ROUTINE W REFLEX MICROSCOPIC
Bilirubin Urine: NEGATIVE
Glucose, UA: NEGATIVE mg/dL
Ketones, ur: NEGATIVE mg/dL
Nitrite: NEGATIVE
Protein, ur: 30 mg/dL — AB
RBC / HPF: >50 RBC/hpf (ref 0–5)
Specific Gravity, Urine: 1.016 (ref 1.005–1.030)
Squamous Epithelial / HPF: 0 /[HPF] (ref 0–5)
WBC, UA: >50 WBC/hpf (ref 0–5)
pH: 7 (ref 5.0–8.0)

## 2023-05-24 MED ORDER — LEVOFLOXACIN 500 MG PO TABS
500.0000 mg | ORAL_TABLET | Freq: Once | ORAL | Status: AC
  Administered 2023-05-24: 500 mg via ORAL
  Filled 2023-05-24: qty 1

## 2023-05-24 MED ORDER — LEVOFLOXACIN 500 MG PO TABS: 500.0000 mg | ORAL_TABLET | Freq: Every day | ORAL | 0 refills | Status: AC

## 2023-05-24 MED ORDER — NAPROXEN 500 MG PO TABS
500.0000 mg | ORAL_TABLET | Freq: Once | ORAL | Status: AC
  Administered 2023-05-24: 500 mg via ORAL
  Filled 2023-05-24: qty 1

## 2023-05-24 MED ORDER — NAPROXEN 500 MG PO TABS: 500.0000 mg | ORAL_TABLET | Freq: Two times a day (BID) | ORAL | 2 refills | Status: DC

## 2023-05-24 NOTE — ED Triage Notes (Signed)
Pt comes with c/o testicular right pain that started yesterday. Pt states week ago he did have some pain with urination but got OTC med.

## 2023-05-24 NOTE — ED Notes (Signed)
Per [place] department they have searched for patient and called his name for about 20 mins and no answer.

## 2023-05-24 NOTE — ED Provider Notes (Signed)
[facility] Provider Note    Event Date/Time   First MD Initiated Contact with Patient 05/24/23 1651     (approximate)   History   Testicle Pain   HPI  Rodney Moody is a 78 y.o. male with history of diabetes who presents with complaints of dysuria, right scrotal pain for nearly a week now.  He took some over-the-counter medication with little improvement.  Per review of records the patient does have hepatocellular carcinoma and is followed at [facility] for this     Physical Exam   Triage Vital Signs: ED Triage Vitals  Encounter Vitals Group     BP 05/24/23 1447 119/72     Systolic BP Percentile --      Diastolic BP Percentile --      Pulse Rate 05/24/23 1447 66     Resp 05/24/23 1447 18     Temp 05/24/23 1447 98 F (36.7 C)     Temp src --      SpO2 05/24/23 1447 100 %     Weight 05/24/23 1445 68.5 kg (151 lb)     Height 05/24/23 1445 1.727 m (5\' 8" )     Head Circumference --      Peak Flow --      Pain Score 05/24/23 1445 5     Pain Loc --      Pain Education --      Exclude from Growth Chart --     Most recent vital signs: Vitals:   05/24/23 1447  BP: 119/72  Pulse: 66  Resp: 18  Temp: 98 F (36.7 C)  SpO2: 100%     General: Awake, no distress.  CV:  Good peripheral perfusion.  Resp:  Normal effort.  Abd:  No distention.  Other:  Tenderness to the right scrotum, mild swelling, no CVA tenderness   ED Results / Procedures / Treatments   Labs (all labs ordered are listed, but only abnormal results are displayed) Labs Reviewed  URINALYSIS, ROUTINE W REFLEX MICROSCOPIC - Abnormal; Notable for the following components:      Result Value   Color, Urine YELLOW (*)    APPearance CLOUDY (*)    Hgb urine dipstick LARGE (*)    Protein, ur 30 (*)    Leukocytes,Ua MODERATE (*)    Bacteria, UA RARE (*)    All other components within normal limits     EKG     RADIOLOGY Ultrasound consistent with  epididymitis    PROCEDURES:  Critical Care performed:   Procedures   MEDICATIONS ORDERED IN ED: Medications  levofloxacin (LEVAQUIN) tablet 500 mg (500 mg Oral Given 05/24/23 1821)  naproxen (NAPROSYN) tablet 500 mg (500 mg Oral Given 05/24/23 1821)     IMPRESSION / MDM / ASSESSMENT AND PLAN / ED COURSE  I reviewed the triage vital signs and the nursing notes. Patient's presentation is most consistent with acute complicated illness / injury requiring diagnostic workup.  Patient presents with right scrotal pain, dysuria as detailed above, suspicious for epididymitis, confirmed by ultrasound and urinalysis  No allergies, no history of kidney disease, will treat with p.o. Levaquin, analgesics close outpatient follow-up recommended, return precautions discussed    Clinical Course as of 05/24/23 1823  Fri May 24, 2023  1818 US SCROTUM W/DOPPLER [LD]    Clinical Course User Index [LD] Cameron Ali, PA-C     FINAL CLINICAL IMPRESSION(S) / ED DIAGNOSES   Final diagnoses:  Epididymitis  Rx / DC Orders   ED Discharge Orders          Ordered    levofloxacin (LEVAQUIN) 500 MG tablet  Daily        05/24/23 1811    naproxen (NAPROSYN) 500 MG tablet  2 times daily with meals        05/24/23 1811             Note:  This document was prepared using Dragon voice recognition software and may include unintentional dictation errors.   Jene Every, MD 05/24/23 [phone]

## 2023-05-24 NOTE — ED Provider Triage Note (Signed)
Emergency Medicine Provider Triage Evaluation Note  Rodney Moody , a 78 y.o. male  was evaluated in triage.  Pt complains of right testicular pain.  Review of Systems  Positive: Testicle pain, dysuria Negative:   Physical Exam  There were no vitals taken for this visit. Gen:   Awake, no distress   Resp:  Normal effort  MSK:   Moves extremities without difficulty  Other:    Medical Decision Making  Medically screening exam initiated at 2:35 PM.  Appropriate orders placed.  Loralie Champagne was informed that the remainder of the evaluation will be completed by another provider, this initial triage assessment does not replace that evaluation, and the importance of remaining in the ED until their evaluation is complete.     Cameron Ali, PA-C 05/24/23 1514

## 2023-06-07 ENCOUNTER — Ambulatory Visit: Payer: BC Managed Care – PPO | Admitting: Family Medicine

## 2023-06-07 ENCOUNTER — Encounter: Payer: Self-pay | Admitting: Family Medicine

## 2023-06-07 VITALS — BP 120/70 | HR 62 | Resp 16 | Ht 68.0 in | Wt 147.9 lb

## 2023-06-07 DIAGNOSIS — C220 Liver cell carcinoma: Secondary | ICD-10-CM

## 2023-06-07 DIAGNOSIS — N451 Epididymitis: Secondary | ICD-10-CM | POA: Diagnosis not present

## 2023-06-07 DIAGNOSIS — E1169 Type 2 diabetes mellitus with other specified complication: Secondary | ICD-10-CM

## 2023-06-07 DIAGNOSIS — E441 Mild protein-calorie malnutrition: Secondary | ICD-10-CM

## 2023-06-07 DIAGNOSIS — Z23 Encounter for immunization: Secondary | ICD-10-CM | POA: Diagnosis not present

## 2023-06-07 DIAGNOSIS — Z8639 Personal history of other endocrine, nutritional and metabolic disease: Secondary | ICD-10-CM

## 2023-06-07 DIAGNOSIS — R779 Abnormality of plasma protein, unspecified: Secondary | ICD-10-CM

## 2023-06-07 DIAGNOSIS — J410 Simple chronic bronchitis: Secondary | ICD-10-CM

## 2023-06-07 DIAGNOSIS — E785 Hyperlipidemia, unspecified: Secondary | ICD-10-CM

## 2023-06-07 DIAGNOSIS — I700 Atherosclerosis of aorta: Secondary | ICD-10-CM | POA: Diagnosis not present

## 2023-06-07 DIAGNOSIS — K219 Gastro-esophageal reflux disease without esophagitis: Secondary | ICD-10-CM

## 2023-06-07 MED ORDER — ROSUVASTATIN CALCIUM 5 MG PO TABS: 5.0000 mg | ORAL_TABLET | Freq: Every day | ORAL | 1 refills | Status: DC

## 2023-06-07 NOTE — Progress Notes (Signed)
 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 Markham also radiation oncologist Dr. Camelia, he was on a treatment  trial at [facility], but last treatment was April 2020. He denies pain, jaundice or tremors or  confusion, urine is normal color, denies acholic stools He denies nausea or vomiting or change in appetite, he eats smaller portion since the treatment. He states treatment affected his appetite and weight was 184 lbs in 2017 before hepatic cancer surgery he went down gradually it was int he 150 lbs range but now is down to higher 140's . CT abdomen being monitored for pancreatic nodule , possible Neuroendocrine tumor and small area on liver that has been stable. Recent labs showed elevated protein and Dr. Markham asked to repeat level and if still high add SPEP   Chronic bronchitis: Quit smoking back in 1999 , he states cough has also resolved and denies any  wheezing or sob. Unchanged    DMII: hgbA1C was 7.0% July 2018, but he lost weight and was having hypoglycemic episodes so we stopped Metformin  a couple of years ago and A1C has been controlled at 6.2 % . He denies polyphagia, polydipsia, he is eating more sweets and has noticed some urinary frequency but secondary to epididymitis .  He has dyslipidemia and has been taking statin therapy    GERD: taking medication states doing well, no heartburn or indigestion, had repeat EGD 10/2019 , it showed chronic gastritis h. Pylori negative  Symptoms controlled with daily PPI, he does not want to stop medication   Iron deficiency anemia:we will recheck labs today    Hyperthyroidism: he was treated years ago with oral medication,  no change in bowel movements or palpitation, last TSH was normal , we will recheck labs    Dyslipidemia/Atherosclerosis of Aorta : he has been taking statin therapy without side effects , we will recheck labs today    History of prostate cancer: used to  see Dr. Gala, no longer having bladder problems and is doing well. Under the care of oncologist for hepatocellular adenocarcinoma. He had a recent episode of epididymitis right side, went to [facility] and was treated with naproxen  and levaquin , pain has resolved, no longer have swelling    Malnutrition:  multifactorial, started to lose weight when on treatment for cancer that started in 2017 . Baseline weight around 184 lbs, and went down to 150's and it was stable for about  2 years, now is down to high 140's lbs . He states has not been eating healthy, choosing sweets instead of vegetables and protein     Patient Active Problem List   Diagnosis Date Noted   Pancreatic mass 03/12/2022   Simple chronic bronchitis (HCC) 09/08/2021   History of hepatitis C virus infection 09/08/2021   Medical contraindication to statin therapy 09/08/2021   Iron deficiency anemia 09/08/2021   History of hyperthyroidism 09/08/2021   Liver cancer, primary, with metastasis from liver to other site [facility]) 09/08/2021   Secondary malignant neoplasm of right lung (HCC) 09/08/2021   Thrombocytopenia (HCC) 09/08/2021   Mild protein-calorie malnutrition (HCC) 10/17/2018  Atherosclerosis of aorta (HCC) 01/03/2018   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) 01/03/2018   Vitamin D deficiency 08/30/2017   H/O malignant neoplasm of prostate 08/30/2017   Hepatocellular carcinoma (HCC) 08/16/2015   Gastroesophageal reflux disease without esophagitis 11/19/2014   Dyslipidemia associated with type 2 diabetes mellitus (HCC) 11/19/2014   Dyslipidemia 11/19/2014    Past Surgical History:  Procedure Laterality Date   COLONOSCOPY W/ BIOPSIES  2014   Patient has polyps-benign    COLONOSCOPY WITH PROPOFOL  N/A 06/04/2016   Procedure: COLONOSCOPY WITH PROPOFOL ;  Surgeon: Gladis RAYMOND Mariner, MD;  Location: [facility] ENDOSCOPY;  Service: Endoscopy;  Laterality: N/A;   COLONOSCOPY WITH PROPOFOL  N/A 05/16/2018   Procedure: COLONOSCOPY WITH PROPOFOL ;  Surgeon: Mariner Gladis RAYMOND, MD;  Location: [facility] ENDOSCOPY;  Service: Endoscopy;  Laterality: N/A;   ESOPHAGOGASTRODUODENOSCOPY N/A 06/04/2016   Procedure: ESOPHAGOGASTRODUODENOSCOPY (EGD);  Surgeon: Gladis RAYMOND Mariner, MD;  Location: [facility] ENDOSCOPY;  Service: Endoscopy;  Laterality: N/A;   laparoscopic resection of liver     left shoulder     PROSTATE BIOPSY      Family History  Problem Relation Age of Onset   Diabetes Mother    Hypertension Mother    Heart disease Brother    Hypertension Brother    Hypertension Brother    Heart attack Brother     Social History   Tobacco Use   Smoking status: Former    Current packs/day: 0.00    Average packs/day: 0.3 packs/day for 30.0 years (7.5 ttl pk-yrs)    Types: Cigarettes    Start date: 02/26/1968    Quit date: 02/25/1998    Years since quitting: 25.2   Smokeless tobacco: Never  Substance Use Topics   Alcohol use: No    Alcohol/week: 0.0 standard drinks of alcohol    Comment: quit in 1999  was not a heavy drinker     Current Outpatient Medications:    aspirin 81 MG tablet, Take 81 mg by mouth daily., Disp: , Rfl:    ferrous gluconate (FERGON) 240 (27 FE) MG tablet, Take 240 mg by mouth daily., Disp: , Rfl:    naproxen  (NAPROSYN ) 500 MG tablet, Take 1 tablet (500 mg total) by mouth 2 (two) times daily with a meal., Disp: 20 tablet, Rfl: 2   pantoprazole  (PROTONIX ) 40 MG tablet, TAKE 1 TABLET BY MOUTH EVERY DAY, Disp: 90 tablet, Rfl: 1   rosuvastatin  (CRESTOR ) 5 MG tablet, Take 1 tablet (5 mg total) by mouth daily., Disp: 90 tablet, Rfl: 1   SODIUM FLUORIDE 5000 SENSITIVE 1.1-5 % GEL, See admin instructions. (Patient not taking: Reported on 06/07/2023), Disp: , Rfl:   Allergies   Allergen Reactions   Lisinopril Cough    I personally reviewed active problem list, medication list, allergies with the patient/caregiver today.   ROS  Ten systems reviewed and is negative except as mentioned in HPI    Objective  Vitals:   06/07/23 1309  BP: 120/70  Pulse: 62  Resp: 16  SpO2: 100%  Weight: 147 lb 14.4 oz (67.1 kg)  Height: 5' 8 (1.727 m)    Body mass index is 22.49 kg/m.  Physical Exam  Constitutional: Patient appears well-developed and temporal waisting  No distress.  HEENT: head atraumatic, normocephalic, pupils equal and reactive to light, , neck supple Cardiovascular: Normal rate, regular rhythm and normal heart sounds.  No murmur heard. No BLE edema. Pulmonary/Chest: Effort normal and breath sounds normal. No respiratory distress. Abdominal: Soft.  There is no tenderness. Psychiatric:  Patient has a normal mood and affect. behavior is normal. Judgment and thought content normal.   Recent Results (from the past 2160 hours)  Urinalysis, Routine w reflex microscopic -Urine, Clean Catch     Status: Abnormal   Collection Time: 05/24/23  2:00 PM  Result Value Ref Range   Color, Urine YELLOW (A) YELLOW   APPearance CLOUDY (A) CLEAR   Specific Gravity, Urine 1.016 1.005 - 1.030   pH 7.0 5.0 - 8.0   Glucose, UA NEGATIVE NEGATIVE mg/dL   Hgb urine dipstick LARGE (A) NEGATIVE   Bilirubin Urine NEGATIVE NEGATIVE   Ketones, ur NEGATIVE NEGATIVE mg/dL   Protein, ur 30 (A) NEGATIVE mg/dL   Nitrite NEGATIVE NEGATIVE   Leukocytes,Ua MODERATE (A) NEGATIVE   RBC / HPF >50 0 - 5 RBC/hpf   WBC, UA >50 0 - 5 WBC/hpf   Bacteria, UA RARE (A) NONE SEEN   Squamous Epithelial / HPF 0 0 - 5 /HPF   Mucus PRESENT     Comment: Performed at [facility], [address] [place], [place] [address]    Diabetic Foot Exam:     PHQ2/9:    06/07/2023    1:08 PM 09/21/2022    8:36 AM 04/24/2022    2:50 PM 03/12/2022    1:16 PM 09/08/2021    1:19 PM   Depression screen PHQ 2/9  Decreased Interest 0 3 0 0 0  Down, Depressed, Hopeless 0 0 0 0 0  PHQ - 2 Score 0 3 0 0 0  Altered sleeping 0 0  0   Tired, decreased energy 0 2  0   Change in appetite 0 1  0   Feeling bad or failure about yourself  0 0  0   Trouble concentrating 0 0  0   Moving slowly or fidgety/restless 0 0  0   Suicidal thoughts 0 0  0   PHQ-9 Score 0 6  0   Difficult doing work/chores Not difficult at all Not difficult at all       phq 9 is negative  Fall Risk:    06/07/2023    1:08 PM 09/21/2022    8:33 AM 04/24/2022    2:50 PM 03/12/2022    1:16 PM 09/08/2021    1:19 PM  Fall Risk   Falls in the past year? 0 1 0 0 0  Number falls in past yr: 0 0 0 0 0  Injury with Fall? 0 1 0 0 0  Risk for fall due to : No Fall Risks History of fall(s)  No Fall Risks No Fall Risks  Follow up Falls prevention discussed;Education provided;Falls evaluation completed Falls evaluation completed;Education provided;Falls prevention discussed Falls evaluation completed Falls prevention discussed Falls prevention discussed     Assessment & Plan  1. Epididymitis (Primary)  Treated by EC, symptoms resolved   2. Atherosclerosis of aorta (HCC)  - Lipid panel, taking crestor    3. Simple chronic bronchitis (HCC)  stable  4. Hepatocellular carcinoma (HCC)  Managed by oncologist   5. Dyslipidemia associated with type 2 diabetes mellitus (HCC)  - Microalbumin / creatinine urine ratio - COMPLETE METABOLIC PANEL WITH GFR - CBC with Differential/Platelet - Hemoglobin A1c - rosuvastatin  (CRESTOR ) 5 MG tablet; Take 1 tablet (5 mg total) by mouth daily.  Dispense: 90 tablet; Refill: 1  6. Mild protein-calorie malnutrition (HCC)  Needs to add protein in his diet - COMPLETE METABOLIC PANEL WITH GFR  7. Gastroesophageal reflux disease without  esophagitis  Controlled   8. History of hyperthyroidism  - TSH  9. Elevated blood protein  If still elevated we will check SPEP

## 2023-06-08 LAB — LIPID PANEL
Cholesterol: 165 mg/dL (ref <200)
HDL: 75 mg/dL (ref >=40)
LDL Cholesterol (Calc): 76 mg/dL
Non-HDL Cholesterol (Calc): 90 mg/dL (ref <130)
Total CHOL/HDL Ratio: 2.2 (calc) (ref <5.0)
Triglycerides: 63 mg/dL (ref <150)

## 2023-06-08 LAB — CBC WITH DIFFERENTIAL/PLATELET
Absolute Lymphocytes: 1281 {cells}/uL (ref 850–3900)
Absolute Monocytes: 244 {cells}/uL (ref 200–950)
Basophils Absolute: 8 {cells}/uL (ref 0–200)
Basophils Relative: 0.2 %
Eosinophils Absolute: 181 {cells}/uL (ref 15–500)
Eosinophils Relative: 4.3 %
HCT: 35.8 % — ABNORMAL LOW (ref 38.5–50.0)
Hemoglobin: 11.5 g/dL — ABNORMAL LOW (ref 13.2–17.1)
MCH: 26.7 pg — ABNORMAL LOW (ref 27.0–33.0)
MCHC: 32.1 g/dL (ref 32.0–36.0)
MCV: 83.3 fL (ref 80.0–100.0)
MPV: 10.3 fL (ref 7.5–12.5)
Monocytes Relative: 5.8 %
Neutro Abs: 2486 {cells}/uL (ref 1500–7800)
Neutrophils Relative %: 59.2 %
Platelets: 218 10*3/uL (ref 140–400)
RBC: 4.3 10*6/uL (ref 4.20–5.80)
RDW: 13.1 % (ref 11.0–15.0)
Total Lymphocyte: 30.5 %
WBC: 4.2 10*3/uL (ref 3.8–10.8)

## 2023-06-08 LAB — COMPLETE METABOLIC PANEL WITH GFR
AG Ratio: 1.3 (calc) (ref 1.0–2.5)
ALT: 15 U/L (ref 9–46)
AST: 23 U/L (ref 10–35)
Albumin: 4.4 g/dL (ref 3.6–5.1)
Alkaline phosphatase (APISO): 68 U/L (ref 35–144)
BUN: 17 mg/dL (ref 7–25)
CO2: 30 mmol/L (ref 20–32)
Calcium: 9.8 mg/dL (ref 8.6–10.3)
Chloride: 102 mmol/L (ref 98–110)
Creat: 0.97 mg/dL (ref 0.70–1.28)
Globulin: 3.5 g/dL (ref 1.9–3.7)
Glucose, Bld: 98 mg/dL (ref 65–99)
Potassium: 4.6 mmol/L (ref 3.5–5.3)
Sodium: 139 mmol/L (ref 135–146)
Total Bilirubin: 0.4 mg/dL (ref 0.2–1.2)
Total Protein: 7.9 g/dL (ref 6.1–8.1)
eGFR: 81 mL/min/{1.73_m2} (ref >=60)

## 2023-06-08 LAB — MICROALBUMIN / CREATININE URINE RATIO
Creatinine, Urine: 142 mg/dL (ref 20–320)
Microalb Creat Ratio: 4 mg/g{creat} (ref <30)
Microalb, Ur: 0.5 mg/dL

## 2023-06-08 LAB — TSH: TSH: 0.59 m[IU]/L (ref 0.40–4.50)

## 2023-06-08 LAB — HEMOGLOBIN A1C
Hgb A1c MFr Bld: 6.5 %{Hb} — ABNORMAL HIGH (ref <5.7)
Mean Plasma Glucose: 140 mg/dL
eAG (mmol/L): 7.7 mmol/L

## 2023-06-10 ENCOUNTER — Encounter: Payer: Self-pay | Admitting: Family Medicine

## 2023-06-27 LAB — OPHTHALMOLOGY REPORT-SCANNED

## 2023-08-09 ENCOUNTER — Encounter: Payer: Self-pay | Admitting: Urology

## 2023-08-09 ENCOUNTER — Ambulatory Visit (INDEPENDENT_AMBULATORY_CARE_PROVIDER_SITE_OTHER): Admitting: Urology

## 2023-08-09 VITALS — BP 128/70 | HR 72 | Ht 68.0 in | Wt 150.0 lb

## 2023-08-09 DIAGNOSIS — Z8546 Personal history of malignant neoplasm of prostate: Secondary | ICD-10-CM | POA: Diagnosis not present

## 2023-08-09 DIAGNOSIS — R972 Elevated prostate specific antigen [PSA]: Secondary | ICD-10-CM

## 2023-08-09 NOTE — Progress Notes (Signed)
 I, Maysun Jamey Mccallum, acting as a scribe for Geraline Knapp, MD., have documented all relevant documentation on the behalf of Geraline Knapp, MD, as directed by Geraline Knapp, MD while in the presence of Geraline Knapp, MD.  08/09/2023 6:10 PM   Rodney Moody [birth year] [id number]  Referring provider: Sowles, Krichna, MD [address] [place],  [place] [address]  Chief Complaint  Patient presents with   Elevated PSA    HPI: Rodney Moody is a 78 y.o. male presents to establish urologic care.  Previously seen by Dr. Fredrick Jenkins for low-risk prostate cancer. Diagnosed with Gleason 3+3 adneocarcinoma of the prostate, and was treated with I-125 brachytherapy in 2011. He was also followed for overactive bladder.  Last office visit with Dr. Fredrick Jenkins was May 2021. PSA at that visit was <0.1  Denies bothersome lower urinary tract symptoms.  Denies dysuria, gross hematuria.  Denies flank, abdominal, or pelvic pain.   PMH: Past Medical History:  Diagnosis Date   Cirrhosis of liver (HCC)    Diabetes mellitus without complication (HCC)    borderline Diabetes, but no medication   Gastritis    GERD (gastroesophageal reflux disease)    Hepatitis B    Hepatitis C    with cirrhosis   Hepatocellular carcinoma (HCC) 10/06/2015   Hyperlipidemia    Prostate cancer [facility])     Surgical History: Past Surgical History:  Procedure Laterality Date   COLONOSCOPY W/ BIOPSIES  2014   Patient has polyps-benign   COLONOSCOPY WITH PROPOFOL N/A 06/04/2016   Procedure: COLONOSCOPY WITH PROPOFOL;  Surgeon: Deveron Fly, MD;  Location: [facility] ENDOSCOPY;  Service: Endoscopy;  Laterality: N/A;   COLONOSCOPY WITH PROPOFOL N/A 05/16/2018   Procedure: COLONOSCOPY WITH PROPOFOL;  Surgeon: Deveron Fly, MD;  Location: [facility] ENDOSCOPY;  Service: Endoscopy;  Laterality: N/A;   ESOPHAGOGASTRODUODENOSCOPY N/A 06/04/2016   Procedure: ESOPHAGOGASTRODUODENOSCOPY (EGD);  Surgeon: Deveron Fly, MD;   Location: [facility] ENDOSCOPY;  Service: Endoscopy;  Laterality: N/A;   laparoscopic resection of liver     left shoulder     PROSTATE BIOPSY      Home Medications:  Allergies as of 08/09/2023       Reactions   Lisinopril Cough        Medication List        Accurate as of August 09, 2023  6:10 PM. If you have any questions, ask your nurse or doctor.          STOP taking these medications    pantoprazole 40 MG tablet Commonly known as: PROTONIX Stopped by: Geraline Knapp   Sodium Fluoride 5000 Sensitive 1.1-5 % Gel Generic drug: Sod Fluoride-Potassium Nitrate Stopped by: Geraline Knapp       TAKE these medications    aspirin 81 MG tablet Take 81 mg by mouth daily.   ferrous gluconate 240 (27 FE) MG tablet Commonly known as: FERGON Take 240 mg by mouth daily.   rosuvastatin 5 MG tablet Commonly known as: Crestor Take 1 tablet (5 mg total) by mouth daily.        Allergies:  Allergies  Allergen Reactions   Lisinopril Cough    Family History: Family History  Problem Relation Age of Onset   Diabetes Mother    Hypertension Mother    Heart disease Brother    Hypertension Brother    Hypertension Brother    Heart attack Brother     Social History:  reports  that he quit smoking about 25 years ago. His smoking use included cigarettes. He started smoking about 55 years ago. He has a 7.5 pack-year smoking history. He has never used smokeless tobacco. He reports that he does not drink alcohol and does not use drugs.   Physical Exam: BP 128/70   Pulse 72   Ht 5\' 8"  (1.727 m)   Wt 150 lb (68 kg)   BMI 22.81 kg/m   Constitutional:  Alert and oriented, No acute distress. HEENT: [place] AT Respiratory: Normal respiratory effort, no increased work of breathing. Psychiatric: Normal mood and affect.   Assessment & Plan:    1. T1c low-risk prostate cancer Status post brachytherapy 2011 Last PSA was in 2021 and PSA was drawn today.  If PSA stable, he will  continue annual follow-up.  I have reviewed the above documentation for accuracy and completeness, and I agree with the above.   Geraline Knapp, MD  [facility] [address], [address] [place], [place] [address] [phone]

## 2023-08-10 LAB — PSA: Prostate Specific Ag, Serum: <0.1 ng/mL (ref 0.0–4.0)

## 2023-11-25 ENCOUNTER — Other Ambulatory Visit: Payer: Self-pay | Admitting: Family Medicine

## 2023-11-25 DIAGNOSIS — E1169 Type 2 diabetes mellitus with other specified complication: Secondary | ICD-10-CM

## 2023-12-06 ENCOUNTER — Ambulatory Visit: Payer: BC Managed Care – PPO | Admitting: Family Medicine

## 2023-12-13 ENCOUNTER — Ambulatory Visit: Admitting: Family Medicine

## 2024-01-31 ENCOUNTER — Ambulatory Visit: Admitting: Family Medicine

## 2024-01-31 ENCOUNTER — Encounter: Payer: Self-pay | Admitting: Family Medicine

## 2024-01-31 VITALS — BP 118/70 | HR 61 | Resp 16 | Ht 68.0 in | Wt 151.4 lb

## 2024-01-31 DIAGNOSIS — E1169 Type 2 diabetes mellitus with other specified complication: Secondary | ICD-10-CM

## 2024-01-31 DIAGNOSIS — K219 Gastro-esophageal reflux disease without esophagitis: Secondary | ICD-10-CM

## 2024-01-31 DIAGNOSIS — E785 Hyperlipidemia, unspecified: Secondary | ICD-10-CM

## 2024-01-31 DIAGNOSIS — C220 Liver cell carcinoma: Secondary | ICD-10-CM | POA: Diagnosis not present

## 2024-01-31 DIAGNOSIS — J410 Simple chronic bronchitis: Secondary | ICD-10-CM

## 2024-01-31 DIAGNOSIS — Z23 Encounter for immunization: Secondary | ICD-10-CM

## 2024-01-31 DIAGNOSIS — I700 Atherosclerosis of aorta: Secondary | ICD-10-CM

## 2024-01-31 DIAGNOSIS — Z8639 Personal history of other endocrine, nutritional and metabolic disease: Secondary | ICD-10-CM

## 2024-01-31 LAB — POCT GLYCOSYLATED HEMOGLOBIN (HGB A1C): Hemoglobin A1C: 6.2 % — AB (ref 4.0–5.6)

## 2024-01-31 MED ORDER — ROSUVASTATIN CALCIUM 5 MG PO TABS: 5.0000 mg | ORAL_TABLET | Freq: Every day | ORAL | 1 refills | Status: AC

## 2024-01-31 NOTE — Progress Notes (Signed)
 Name: Rodney Moody   MRN: [id number]    DOB: [birth year]   Date:01/31/2024       Progress Note  Subjective  Chief Complaint  Chief Complaint  Patient presents with   Medical Management of Chronic Issues   HPI   Adenocarcinoma of liver , history of hepatitis C ( treated years ago)  and mets to mediastinal area: he no longer seeding radiation oncologist however Dr. Markham sates to return yearly for repeat CT chest,abdomen and pelvis , CBC, CMP , chromogranin A and AFP.  Last  treatment was April 2020. He denies pain, jaundice or tremors or  confusion, urine is normal color, denies acholic stools He denies nausea or vomiting or change in appetite, he eats smaller portion since the treatment. He states treatment affected his appetite and weight was 184 lbs in 2017 before hepatic cancer surgery he went down to 140's but is now trending back up. CT abdomen being monitored for pancreatic nodule , possible Neuroendocrine tumor and small area on liver that has been stable. Patient states he does not want to go back to see Dr. Markham this year.    Chronic bronchitis: Quit smoking back in 1999 , he states cough has also resolved and denies any  wheezing or sob.    DMII: hgbA1C was 7.0% July 2018, but he lost weight and was having hypoglycemic episodes so we stopped Metformin  a couple of years ago and A1C has been controlled at 6.2 % today. He denies polyphagia, polydipsia.  He has dyslipidemia and has been taking statin therapy    GERD: taking medication states doing well, no heartburn or indigestion, had repeat EGD 10/2019 , it showed chronic gastritis h. Pylori negative  He was able to stop taking PPI and has no symptoms at this time   Hyperthyroidism: he was treated years ago with oral medication, no change in bowel movements or palpitation, last TSH was normal , we will continue to monitor it yearly    Dyslipidemia/Atherosclerosis of Aorta : he has been taking statin therapy without side effects , last  LDL was at goal    History of prostate cancer: used to  see Dr. Gala, no longer having bladder problems and is doing well.      Patient Active Problem List   Diagnosis Date Noted   Pancreatic mass 03/12/2022   Simple chronic bronchitis (HCC) 09/08/2021   History of hepatitis C virus infection 09/08/2021   Medical contraindication to statin therapy 09/08/2021   Iron deficiency anemia 09/08/2021   History of hyperthyroidism 09/08/2021   Liver cancer, primary, with metastasis from liver to other site [facility]) 09/08/2021   Secondary malignant neoplasm of right lung (HCC) 09/08/2021   Thrombocytopenia 09/08/2021   Mild protein-calorie malnutrition 10/17/2018   Atherosclerosis of aorta 01/03/2018   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) 01/03/2018   Vitamin D deficiency 08/30/2017   H/O malignant neoplasm of prostate 08/30/2017   Hepatocellular carcinoma (HCC) 08/16/2015   Gastroesophageal reflux disease without esophagitis 11/19/2014   Dyslipidemia associated with type 2 diabetes mellitus (HCC) 11/19/2014   Dyslipidemia 11/19/2014    Past Surgical History:  Procedure Laterality Date   COLONOSCOPY W/ BIOPSIES  2014   Patient has polyps-benign   COLONOSCOPY WITH PROPOFOL  N/A 06/04/2016   Procedure: COLONOSCOPY WITH PROPOFOL ;  Surgeon: Gladis RAYMOND Mariner, MD;  Location: [facility] ENDOSCOPY;  Service: Endoscopy;  Laterality: N/A;   COLONOSCOPY WITH PROPOFOL  N/A 05/16/2018   Procedure: COLONOSCOPY WITH PROPOFOL ;  Surgeon: Mariner Gladis RAYMOND, MD;  Location: [facility] ENDOSCOPY;  Service: Endoscopy;  Laterality: N/A;   ESOPHAGOGASTRODUODENOSCOPY N/A 06/04/2016   Procedure: ESOPHAGOGASTRODUODENOSCOPY (EGD);  Surgeon: Gladis RAYMOND Mariner, MD;  Location: [facility] ENDOSCOPY;  Service: Endoscopy;  Laterality: N/A;   laparoscopic resection of liver     left shoulder     PROSTATE BIOPSY      Family History  Problem Relation Age of Onset   Diabetes Mother    Hypertension Mother    Heart disease Brother     Hypertension Brother    Hypertension Brother    Heart attack Brother     Social History   Tobacco Use   Smoking status: Former    Current packs/day: 0.00    Average packs/day: 0.3 packs/day for 30.0 years (7.5 ttl pk-yrs)    Types: Cigarettes    Start date: 02/26/1968    Quit date: 02/25/1998    Years since quitting: 25.9   Smokeless tobacco: Never  Substance Use Topics   Alcohol use: No    Alcohol/week: 0.0 standard drinks of alcohol    Comment: quit in 1999  was not a heavy drinker     Current Outpatient Medications:    aspirin 81 MG tablet, Take 81 mg by mouth daily., Disp: , Rfl:    rosuvastatin  (CRESTOR ) 5 MG tablet, Take 1 tablet (5 mg total) by mouth daily., Disp: 90 tablet, Rfl: 1  Allergies  Allergen Reactions   Lisinopril Cough    I personally reviewed active problem list, medication list, allergies with the patient/caregiver today.   ROS  Ten systems reviewed and is negative except as mentioned in HPI    Objective Physical Exam Constitutional: Patient appears well-developed and well-nourished.  No distress.  HEENT: head atraumatic, normocephalic, pupils equal and reactive to light, neck supple Cardiovascular: Normal rate, regular rhythm and normal heart sounds.  No murmur heard. No BLE edema. Pulmonary/Chest: Effort normal and breath sounds normal. No respiratory distress. Abdominal: Soft.  There is no tenderness. Psychiatric: Patient has a normal mood and affect. behavior is normal. Judgment and thought content normal.   Vitals:   01/31/24 1438  BP: 118/70  Pulse: 61  Resp: 16  SpO2: 99%  Weight: 151 lb 6.4 oz (68.7 kg)  Height: 5' 8 (1.727 m)    Body mass index is 23.02 kg/m.  Recent Results (from the past 2160 hours)  POCT glycosylated hemoglobin (Hb A1C)     Status: Abnormal   Collection Time: 01/31/24  2:42 PM  Result Value Ref Range   Hemoglobin A1C 6.2 (A) 4.0 - 5.6 %   HbA1c POC (<> result, manual entry)     HbA1c, POC  (prediabetic range)     HbA1c, POC (controlled diabetic range)      Diabetic Foot Exam:  Diabetic foot exam was performed with the following findings:   Normal sensation of 10g monofilament Intact posterior tibialis and dorsalis pedis pulses Thick toenails.       PHQ2/9:    01/31/2024    2:31 PM 06/07/2023    1:08 PM 09/21/2022    8:36 AM 04/24/2022    2:50 PM 03/12/2022    1:16 PM  Depression screen PHQ 2/9  Decreased Interest 0 0 3 0 0  Down, Depressed, Hopeless 0 0 0 0 0  PHQ - 2 Score 0 0 3 0 0  Altered sleeping 0 0 0  0  Tired, decreased energy 0 0 2  0  Change in appetite 0 0 1  0  Feeling bad  or failure about yourself  0 0 0  0  Trouble concentrating 0 0 0  0  Moving slowly or fidgety/restless 0 0 0  0  Suicidal thoughts 0 0 0  0  PHQ-9 Score 0 0 6  0  Difficult doing work/chores Not difficult at all Not difficult at all Not difficult at all      phq 9 is negative  Fall Risk:    01/31/2024    2:31 PM 06/07/2023    1:08 PM 09/21/2022    8:33 AM 04/24/2022    2:50 PM 03/12/2022    1:16 PM  Fall Risk   Falls in the past year? 1 0 1 0 0  Number falls in past yr: 1 0 0 0 0  Injury with Fall? 0 0 1 0 0  Risk for fall due to : Impaired balance/gait No Fall Risks History of fall(s)  No Fall Risks  Follow up Falls evaluation completed Falls prevention discussed;Education provided;Falls evaluation completed Falls evaluation completed;Education provided;Falls prevention discussed Falls evaluation completed  Falls prevention discussed      Data saved with a previous flowsheet row definition     Assessment & Plan  1. Dyslipidemia associated with type 2 diabetes mellitus (HCC) (Primary)  - POCT glycosylated hemoglobin (Hb A1C) - HM Diabetes Foot Exam - rosuvastatin  (CRESTOR ) 5 MG tablet; Take 1 tablet (5 mg total) by mouth daily.  Dispense: 90 tablet; Refill: 1  2. Simple chronic bronchitis (HCC)  Not on medications, doing well   3. Hepatocellular carcinoma  (HCC)  Does not want to go back to [facility] for repeat labs or CT's this Fall. He will contact Dr. Render office   4. Atherosclerosis of aorta  Continue statin therapy   5. Need for influenza vaccination  - Flu vaccine HIGH DOSE PF(Fluzone Trivalent)  6. History of hyperthyroidism  Recheck TSH prn  7. Gastroesophageal reflux disease without esophagitis  Doing well off medications

## 2024-04-06 ENCOUNTER — Other Ambulatory Visit: Payer: Self-pay | Admitting: Gastroenterology

## 2024-04-06 DIAGNOSIS — C220 Liver cell carcinoma: Secondary | ICD-10-CM

## 2024-04-06 DIAGNOSIS — K7460 Unspecified cirrhosis of liver: Secondary | ICD-10-CM

## 2024-04-06 DIAGNOSIS — D3A8 Other benign neuroendocrine tumors: Secondary | ICD-10-CM

## 2024-04-08 ENCOUNTER — Ambulatory Visit: Admission: RE | Admit: 2024-04-08 | Discharge: 2024-04-08 | Attending: Gastroenterology | Admitting: Gastroenterology

## 2024-04-08 DIAGNOSIS — K7460 Unspecified cirrhosis of liver: Secondary | ICD-10-CM | POA: Diagnosis present

## 2024-04-08 DIAGNOSIS — D3A8 Other benign neuroendocrine tumors: Secondary | ICD-10-CM | POA: Diagnosis present

## 2024-04-08 DIAGNOSIS — C220 Liver cell carcinoma: Secondary | ICD-10-CM | POA: Insufficient documentation

## 2024-04-08 MED ORDER — IOHEXOL 300 MG/ML  SOLN
100.0000 mL | Freq: Once | INTRAMUSCULAR | Status: AC | PRN
  Administered 2024-04-08: 100 mL via INTRAVENOUS

## 2024-05-28 ENCOUNTER — Ambulatory Visit

## 2024-06-05 ENCOUNTER — Encounter: Payer: Self-pay | Admitting: Family Medicine

## 2024-06-05 ENCOUNTER — Ambulatory Visit: Admitting: Family Medicine

## 2024-06-05 VITALS — BP 116/72 | HR 58 | Resp 16 | Ht 68.0 in | Wt 154.1 lb

## 2024-06-05 DIAGNOSIS — C7801 Secondary malignant neoplasm of right lung: Secondary | ICD-10-CM

## 2024-06-05 DIAGNOSIS — I700 Atherosclerosis of aorta: Secondary | ICD-10-CM

## 2024-06-05 DIAGNOSIS — E1169 Type 2 diabetes mellitus with other specified complication: Secondary | ICD-10-CM

## 2024-06-05 DIAGNOSIS — C781 Secondary malignant neoplasm of mediastinum: Secondary | ICD-10-CM

## 2024-06-05 DIAGNOSIS — K219 Gastro-esophageal reflux disease without esophagitis: Secondary | ICD-10-CM

## 2024-06-05 DIAGNOSIS — C228 Malignant neoplasm of liver, primary, unspecified as to type: Secondary | ICD-10-CM

## 2024-06-05 DIAGNOSIS — K7469 Other cirrhosis of liver: Secondary | ICD-10-CM | POA: Insufficient documentation

## 2024-06-05 DIAGNOSIS — J410 Simple chronic bronchitis: Secondary | ICD-10-CM

## 2024-06-05 LAB — POCT GLYCOSYLATED HEMOGLOBIN (HGB A1C): Hemoglobin A1C: 6.1 % — AB (ref 4.0–5.6)

## 2024-06-05 NOTE — Progress Notes (Signed)
 Name: Rodney Moody   MRN: [id number]    DOB: 29-Apr-1946   Date:06/05/2024       Progress Note  Subjective  Chief Complaint  Chief Complaint  Patient presents with   Medical Management of Chronic Issues   Discussed the use of AI scribe software for clinical note transcription with the patient, who gave verbal consent to proceed.  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 A CT scan of the chest, abdomen, and pelvis was performed on December 10th. He has undergone radiation therapy for his cancer and was last seen by an oncologist in 2023. He feels well and has not experienced any new symptoms related to his cancer.  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 However, it was not visible on recent CT scans.  He has a history of prostate cancer treated with brachytherapy. His PSA levels are monitored annually and were last recorded as negative, below 0.1.  He has chronic hepatitis C and is hepatitis B core antibody positive. His liver enzymes were reported as normal, and he has a history of cirrhosis.  He has type 2 diabetes, which he manages with diet. His hemoglobin A1c has decreased from 6.2 to 6.1, indicating good control. He monitors his blood glucose levels at home, with readings typically ranging from 70 to 140 mg/dL.  He takes rosuvastatin  5 mg daily for dyslipidemia. No muscle aches or other side effects from the medication.  He has a history of chronic bronchitis but reports no current symptoms such as cough, wheezing, or shortness of breath. He quit smoking many years ago.  He has a history of aortic iliac plaque and a cholecystectomy.  No nausea, vomiting, heartburn, indigestion,  or changes in bowel movements. He maintains a weight between 154 and 156 pounds. No symptoms of diabetes such as polyuria or polydipsia. No respiratory symptoms such as cough or wheezing.    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) 01/03/2018   Vitamin D deficiency 08/30/2017   H/O malignant neoplasm of prostate 08/30/2017   Hepatocellular carcinoma (HCC) 08/16/2015   Gastroesophageal reflux disease without esophagitis 11/19/2014   Dyslipidemia associated with type 2 diabetes mellitus (HCC) 11/19/2014   Dyslipidemia 11/19/2014    Past Surgical History:  Procedure Laterality Date   COLONOSCOPY W/ BIOPSIES  2014   Patient has polyps-benign   COLONOSCOPY WITH PROPOFOL  N/A 06/04/2016   Procedure: COLONOSCOPY WITH PROPOFOL ;  Surgeon: Gladis RAYMOND Mariner, MD;  Location[facility] ENDOSCOPY;  Service: Endoscopy;  Laterality: N/A;   COLONOSCOPY WITH PROPOFOL  N/A 05/16/2018   Procedure: COLONOSCOPY WITH PROPOFOL ;  Surgeon: Mariner Gladis RAYMOND, MD;  Location: [facility] ENDOSCOPY;  Service: Endoscopy;  Laterality: N/A;   ESOPHAGOGASTRODUODENOSCOPY N/A 06/04/2016   Procedure:  ESOPHAGOGASTRODUODENOSCOPY (EGD);  Surgeon: Gladis RAYMOND Mariner, MD;  Location: [facility] ENDOSCOPY;  Service: Endoscopy;  Laterality: N/A;   laparoscopic resection of liver     left shoulder     PROSTATE BIOPSY      Family History  Problem Relation Age of Onset   Diabetes  Mother    Hypertension Mother    Heart disease Brother    Hypertension Brother    Hypertension Brother    Heart attack Brother     Social History   Tobacco Use   Smoking status: Former    Current packs/day: 0.00    Average packs/day: 0.3 packs/day for 30.0 years (7.5 ttl pk-yrs)    Types: Cigarettes    Start date: 02/26/1968    Quit date: 02/25/1998    Years since quitting: 26.2   Smokeless tobacco: Never  Substance Use Topics   Alcohol use: No    Alcohol/week: 0.0 standard drinks of alcohol    Comment: quit in 1999  was not a heavy drinker    Current Medications[1]  Allergies[2]  I personally reviewed active problem list, medication list, allergies, family history with the patient/caregiver today.   ROS  Ten systems reviewed and is negative except as mentioned in HPI    Objective Physical Exam  CONSTITUTIONAL: Patient appears well-developed and well-nourished.  No distress. HEENT: Head atraumatic, normocephalic, neck supple. CARDIOVASCULAR: Normal rate, regular rhythm and normal heart sounds.  No murmur heard. No BLE edema. PULMONARY: Effort normal and breath sounds normal. No respiratory distress. ABDOMINAL: There is no tenderness or distention. MUSCULOSKELETAL: Normal gait. Without gross motor or sensory deficit. PSYCHIATRIC: Patient has a normal mood and affect. behavior is normal. Judgment and thought content normal.  Vitals:   06/05/24 1339  BP: 116/72  Pulse: (!) 58  Resp: 16  SpO2: 98%  Weight: 154 lb 1.6 oz (69.9 kg)  Height: 5' 8 (1.727 m)    Body mass index is 23.43 kg/m.  Recent Results (from the past 2160 hours)  POCT glycosylated hemoglobin (Hb A1C)     Status: Abnormal   Collection Time: 06/05/24  1:50 PM  Result Value Ref Range   Hemoglobin A1C 6.1 (A) 4.0 - 5.6 %   HbA1c POC (<> result, manual entry)     HbA1c, POC (prediabetic range)     HbA1c, POC (controlled diabetic range)        PHQ2/9:    06/05/2024    1:38 PM 01/31/2024     2:31 PM 06/07/2023    1:08 PM 09/21/2022    8:36 AM 04/24/2022    2:50 PM  Depression screen PHQ 2/9  Decreased Interest 0 0 0 3 0  Down, Depressed, Hopeless 0 0 0 0 0  PHQ - 2 Score 0 0 0 3 0  Altered sleeping  0 0 0   Tired, decreased energy  0 0 2   Change in appetite  0 0 1   Feeling bad or failure about yourself   0 0 0   Trouble concentrating  0 0 0   Moving slowly or fidgety/restless  0 0 0   Suicidal thoughts  0 0 0   PHQ-9 Score  0  0  6    Difficult doing work/chores  Not difficult at all Not difficult at all Not difficult at all      Data saved with a previous flowsheet row definition    phq 9 is negative  Fall Risk:    06/05/2024  1:38 PM 01/31/2024    2:31 PM 06/07/2023    1:08 PM 09/21/2022    8:33 AM 04/24/2022    2:50 PM  Fall Risk   Falls in the past year? 0 1 0 1 0  Number falls in past yr: 0 1 0 0 0  Injury with Fall? 0 0  0  1  0   Risk for fall due to : No Fall Risks Impaired balance/gait No Fall Risks History of fall(s)   Follow up Falls evaluation completed Falls evaluation completed Falls prevention discussed;Education provided;Falls evaluation completed Falls evaluation completed;Education provided;Falls prevention discussed Falls evaluation completed      Data saved with a previous flowsheet row definition      Assessment & Plan Metastatic hepatocellular carcinoma with metastases to mediastinum and right lung Hepatocellular carcinoma with metastases to mediastinum and right lung. Recent CT shows no lymphadenopathy, unremarkable liver, active pancreatic mass. No oncologist follow-up since 2023. - Referred to local oncologist for monitoring and follow-up. - Scheduled oncology appointment on Friday.  Cirrhosis of liver Cirrhosis with previous dysfunction. Recent liver enzymes normal, no symptoms of dysfunction.  Type 2 diabetes mellitus with dyslipidemia Type 2 diabetes well-controlled with diet. Hemoglobin A1c decreased to 6.1. Dyslipidemia  managed with rosuvastatin , no muscle aches. - Continue rosuvastatin  5 mg daily. - Checked lipid panel. - Continue dietary management for diabetes.  Atherosclerosis of aorta Aortic iliac plaque noted on imaging, no acute changes.  Simple chronic bronchitis No symptoms of cough, wheezing, or shortness of breath. No inhaler use.  Gastroesophageal reflux disease No symptoms of heartburn or indigestion.  General health maintenance Routine health maintenance discussed. PSA levels negative, no additional blood work needed. - Performed urine microalbumin test. - Continue annual PSA monitoring.        [1]  Current Outpatient Medications:    aspirin 81 MG tablet, Take 81 mg by mouth daily., Disp: , Rfl:    rosuvastatin  (CRESTOR ) 5 MG tablet, Take 1 tablet (5 mg total) by mouth daily., Disp: 90 tablet, Rfl: 1 [2]  Allergies Allergen Reactions   Lisinopril Cough

## 2024-08-05 ENCOUNTER — Other Ambulatory Visit

## 2024-08-07 ENCOUNTER — Ambulatory Visit: Admitting: Urology

## 2024-12-11 ENCOUNTER — Ambulatory Visit: Admitting: Family Medicine
